# Patient Record
Sex: Female | Born: 1965 | Race: White | Hispanic: No | Marital: Married | Smoking: Never smoker
Health system: Southern US, Community
[De-identification: ages and names within clinical notes are randomized; demographics above are authoritative.]

## PROBLEM LIST (undated history)

## (undated) DIAGNOSIS — F419 Anxiety disorder, unspecified: Secondary | ICD-10-CM

## (undated) DIAGNOSIS — E669 Obesity, unspecified: Secondary | ICD-10-CM

## (undated) DIAGNOSIS — E1169 Type 2 diabetes mellitus with other specified complication: Secondary | ICD-10-CM

## (undated) DIAGNOSIS — I10 Essential (primary) hypertension: Secondary | ICD-10-CM

## (undated) DIAGNOSIS — Z789 Other specified health status: Secondary | ICD-10-CM

## (undated) HISTORY — PX: ABDOMINAL HYSTERECTOMY: SHX81

## (undated) HISTORY — PX: APPENDECTOMY: SHX54

## (undated) HISTORY — PX: LAPAROSCOPIC GASTRIC BANDING: SHX1100

---

## 1997-03-01 ENCOUNTER — Inpatient Hospital Stay (HOSPITAL_COMMUNITY): Admission: AD | Admit: 1997-03-01 | Discharge: 1997-03-01 | Payer: Self-pay | Admitting: Gynecology

## 1997-04-07 ENCOUNTER — Observation Stay (HOSPITAL_COMMUNITY): Admission: AD | Admit: 1997-04-07 | Discharge: 1997-04-08 | Payer: Self-pay | Admitting: Gynecology

## 1997-04-15 ENCOUNTER — Inpatient Hospital Stay (HOSPITAL_COMMUNITY): Admission: AD | Admit: 1997-04-15 | Discharge: 1997-04-20 | Payer: Self-pay | Admitting: Obstetrics and Gynecology

## 1997-04-23 ENCOUNTER — Inpatient Hospital Stay (HOSPITAL_COMMUNITY): Admission: AD | Admit: 1997-04-23 | Discharge: 1997-04-23 | Payer: Self-pay | Admitting: Obstetrics and Gynecology

## 1997-04-24 ENCOUNTER — Inpatient Hospital Stay (HOSPITAL_COMMUNITY): Admission: AD | Admit: 1997-04-24 | Discharge: 1997-04-24 | Payer: Self-pay | Admitting: Obstetrics and Gynecology

## 1997-04-27 ENCOUNTER — Inpatient Hospital Stay (HOSPITAL_COMMUNITY): Admission: AD | Admit: 1997-04-27 | Discharge: 1997-04-30 | Payer: Self-pay | Admitting: Gynecology

## 1997-06-09 ENCOUNTER — Ambulatory Visit (HOSPITAL_COMMUNITY): Admission: RE | Admit: 1997-06-09 | Discharge: 1997-06-09 | Payer: Self-pay | Admitting: Gynecology

## 2002-12-08 ENCOUNTER — Other Ambulatory Visit: Admission: RE | Admit: 2002-12-08 | Discharge: 2002-12-08 | Payer: Self-pay | Admitting: Obstetrics and Gynecology

## 2003-04-26 ENCOUNTER — Ambulatory Visit (HOSPITAL_COMMUNITY): Admission: RE | Admit: 2003-04-26 | Discharge: 2003-04-26 | Payer: Self-pay | Admitting: Orthopedic Surgery

## 2003-12-21 ENCOUNTER — Ambulatory Visit (HOSPITAL_COMMUNITY): Admission: RE | Admit: 2003-12-21 | Discharge: 2003-12-21 | Payer: Self-pay | Admitting: Family Medicine

## 2004-01-10 ENCOUNTER — Ambulatory Visit (HOSPITAL_COMMUNITY): Admission: RE | Admit: 2004-01-10 | Discharge: 2004-01-10 | Payer: Self-pay | Admitting: Otolaryngology

## 2004-01-31 ENCOUNTER — Encounter: Admission: RE | Admit: 2004-01-31 | Discharge: 2004-04-30 | Payer: Self-pay

## 2004-02-06 ENCOUNTER — Ambulatory Visit (HOSPITAL_COMMUNITY): Admission: RE | Admit: 2004-02-06 | Discharge: 2004-02-06 | Payer: Self-pay | Admitting: Family Medicine

## 2004-07-19 ENCOUNTER — Encounter: Payer: Self-pay | Admitting: Obstetrics and Gynecology

## 2004-07-19 ENCOUNTER — Inpatient Hospital Stay (HOSPITAL_COMMUNITY): Admission: RE | Admit: 2004-07-19 | Discharge: 2004-07-22 | Payer: Self-pay | Admitting: Obstetrics and Gynecology

## 2006-07-11 ENCOUNTER — Emergency Department (HOSPITAL_COMMUNITY): Admission: EM | Admit: 2006-07-11 | Discharge: 2006-07-11 | Payer: Self-pay | Admitting: Emergency Medicine

## 2006-07-17 ENCOUNTER — Emergency Department (HOSPITAL_COMMUNITY): Admission: EM | Admit: 2006-07-17 | Discharge: 2006-07-17 | Payer: Self-pay | Admitting: Emergency Medicine

## 2006-07-17 ENCOUNTER — Encounter (HOSPITAL_COMMUNITY): Admission: RE | Admit: 2006-07-17 | Discharge: 2006-08-16 | Payer: Self-pay | Admitting: Orthopaedic Surgery

## 2006-08-19 ENCOUNTER — Encounter (HOSPITAL_COMMUNITY): Admission: RE | Admit: 2006-08-19 | Discharge: 2006-09-18 | Payer: Self-pay | Admitting: Orthopaedic Surgery

## 2006-09-17 ENCOUNTER — Encounter: Admission: RE | Admit: 2006-09-17 | Discharge: 2006-09-17 | Payer: Self-pay | Admitting: Neurosurgery

## 2006-11-05 ENCOUNTER — Encounter (HOSPITAL_COMMUNITY): Admission: RE | Admit: 2006-11-05 | Discharge: 2006-12-05 | Payer: Self-pay | Admitting: Anesthesiology

## 2006-12-09 ENCOUNTER — Encounter (HOSPITAL_COMMUNITY): Admission: RE | Admit: 2006-12-09 | Discharge: 2007-01-08 | Payer: Self-pay | Admitting: Anesthesiology

## 2008-08-19 ENCOUNTER — Encounter (INDEPENDENT_AMBULATORY_CARE_PROVIDER_SITE_OTHER): Payer: Self-pay | Admitting: Family Medicine

## 2008-08-19 ENCOUNTER — Ambulatory Visit (HOSPITAL_COMMUNITY): Admission: RE | Admit: 2008-08-19 | Discharge: 2008-08-19 | Payer: Self-pay | Admitting: Family Medicine

## 2008-10-16 ENCOUNTER — Emergency Department (HOSPITAL_COMMUNITY): Admission: EM | Admit: 2008-10-16 | Discharge: 2008-10-16 | Payer: Self-pay | Admitting: Emergency Medicine

## 2009-01-11 ENCOUNTER — Encounter: Payer: Self-pay | Admitting: Gastroenterology

## 2009-02-06 ENCOUNTER — Ambulatory Visit: Payer: Self-pay | Admitting: Gastroenterology

## 2009-02-06 ENCOUNTER — Ambulatory Visit (HOSPITAL_COMMUNITY): Admission: RE | Admit: 2009-02-06 | Discharge: 2009-02-06 | Payer: Self-pay | Admitting: Gastroenterology

## 2009-02-08 ENCOUNTER — Encounter (INDEPENDENT_AMBULATORY_CARE_PROVIDER_SITE_OTHER): Payer: Self-pay

## 2009-10-17 ENCOUNTER — Ambulatory Visit (HOSPITAL_COMMUNITY): Admission: RE | Admit: 2009-10-17 | Discharge: 2009-10-17 | Payer: Self-pay | Admitting: Family Medicine

## 2010-02-20 NOTE — Letter (Signed)
Summary: Patient Notice, Colon Biopsy Results  [facility]  [address]   [place], [place] [address]   Phone: [phone]  Fax: [phone]       February 08, 2009   [name] [address] [place], [place]  [address] 04/21/1965    Dear Ms. Pacer,  I am pleased to inform you that the biopsies taken during your recent colonoscopy did not show any evidence of cancer upon pathologic examination.  Additional information/recommendations:  __Please inform all first degree relatives (brothers, sisters, children etc) that they should have screening colonoscopy starting at age 19  __You should have a repeat colonoscopy examination  in 5 years.  Please call us if you are having persistent problems or have questions about your condition that have not been fully answered at this time.  Sincerely,    Hendricks Limes LPN  [facility] Ph: [phone]    Fax: [phone]

## 2010-06-08 NOTE — Op Note (Signed)
NAMELUCINA, [name] NO.:  [id number]   MEDICAL RECORD NO.:  [id number]          PATIENT TYPE:  INP   LOCATION:  A418                          FACILITY:  [facility]   PHYSICIAN:  Tilda Burrow, M.D. DATE OF BIRTH:  1966/01/12   DATE OF PROCEDURE:  07/19/2004  DATE OF DISCHARGE:                                 OPERATIVE REPORT   PREOPERATIVE DIAGNOSES:  1.  Dyspareunia.  2.  Right lower quadrant pain.  3.  Obesity with panniculus.   POSTOPERATIVE DIAGNOSES:  1.  Dyspareunia.  2.  Right lower quadrant pain.  3.  Obesity with panniculus.   OPERATION PERFORMED:  1.  Total abdominal hysterectomy.  2.  Right salpingo-oophorectomy.  3.  Appendectomy.  4.  Panniculectomy.   SURGEON:  Tilda Burrow, M.D.   ASSISTANT:  _Deborah  Dallas, R. N.   ANESTHESIA:  General.   COMPLICATIONS:  None.   FINDINGS:  Grossly normal ovaries bilaterally, mild, upper limits.  Normal  uterus.  Extensive bladder flap adhesions fro sites of prior cesarean  sections.   DETAILS OF SURGERY:  The patient was brought to the operating room, and was  prepped and draped for command abdominal and vaginal procedures with  Flowtron's all in place.  The Foley catheter was inserted.  Vaginal prepping  was performed  and the abdomen was prepped.  She had previously been marked  for the extent of the panniculectomy.  This marked area was approximately 4  inches at the anterosuperior iliac crest, 5 inches at the midline and  approximately 80 cm in total length.  This was then incised beginning first  on the right side, excising the inferior margin of the panniculectomy area  staying just above the inguinal crease all the way to the anterosuperior  iliac crest, and extending midway between the anterior and posterior iliac  wing.  We were able to undermine the tissue and remove a layer of fatty  tissue along with the skin leaving a 1 cm or more area of subcutaneous fat  on the overlying  fascia.  This easily allowed removal of the 10 cm wide  ellipse of skin and we were able to achieve hemostasis with point cautery as  necessary.  We then irrigated and immediately proceeded to close the lateral  aspects of the panniculectomy defect using interrupted 2-0 plain sutures.   The middle portion of the abdomen was left with access to the midline.  We  then entered the abdomen with Pelosi incision modification.  We used the  Bovie cautery down to the fascia, opened the fascia in the midline.  We were  able to inspect and found no evidence of any injury related to the entry  process.  The bowel was packed away.  The uterus was identified and found to  be densely adherent on its anterior lower uterine segment.  A vertical  midline incision was performed.  The bowel was packed away.  We then took  down the round ligament from both sides of the uterus.  I inspected the  right adnexa and considered leaving the right ovary; but,  based on the  patient's complaints and specific request that the right ovary be removed  based on her preoperative pain and complaints we went ahead with removal of  the right tube and ovary.  The round ligaments were taken down bilaterally  with a suture ligature, transected these and the right infundibulopelvic  ligament isolated.  This was clamped, cut and suture ligated.  The left side  was left in situ and we crossclamped the utero-ovarian ligament on the left,  doubly ligating.  Hemostasis was achieved.   We then marched down the broad ligament and the upper cardinal ligaments,  serially clamping, cutting and suture ligating on either side.  The patient  had the uterus taken down, clamping, cutting and suture ligating on either  side reaching the cervix.  We amputated the upper portion of the uterus off  the lower uterine segment and then continued to march down to the cervix.  The cervix was amputated off the vaginal cuff and all rigid stitches placed   at each lateral vaginal angle.  The cuff was then closed in the midline  being careful to develop a small pouch in the posterior aspect of the cuff  to reduce _likelihood of__  dyspareunia in the future.   Irrigation of the pelvis was followed.  Then at the completion of the  procedure we closed the peritoneum after adequate hemostasis.   The patient had closure of the abdominal incision with placement of two flat  JP drains beneath the _subcutaneous skin and fatty tissue .   The patient went to the recovery room in good condition.   COUNTS:  Sponge and needle counts were correct.       JVF/MEDQ  D:  07/20/2004  T:  07/20/2004  Job:  [id number]

## 2010-06-08 NOTE — Op Note (Signed)
NAME:  [name], [name]                     ACCOUNT NO.:  [id number]   MEDICAL RECORD NO.:  [id number]                   PATIENT TYPE:  AMB   LOCATION:  DAY                                  FACILITY:  [facility]   PHYSICIAN:  Vickki Hearing, M.D.           DATE OF BIRTH:  1965-04-22   DATE OF PROCEDURE:  DATE OF DISCHARGE:                                 OPERATIVE REPORT   PREOPERATIVE DIAGNOSIS:  Right volar ganglion.   POSTOPERATIVE DIAGNOSIS:  Right volar ganglion.   PROCEDURE:  Excision of mass right wrist.   SURGEON:  Vickki Hearing, M.D.   OPERATIVE FINDINGS:  Right volar ganglion.   Painful lesion of the right wrist over its volar aspect consistent with a  ganglion cyst.   The patient was identified preoperatively as [name].  She placed  a mark over her right wrist and signed by initials over that.  We reviewed  her medical record including consent and history and physical.  She was  given preoperative antibiotics and taken to the operating room for a Bier  block.  After sterile prep and drape we took the appropriate time out,  confirmed her identity, proper extremity for surgery and the proper  procedure; everyone agreed we proceeded.   A longitudinal incision was made over the mass.  This was bluntly dissected  until the mass was removed.  There were several small veins and an arterial  branch wrapped around the mass and these were carefully dissected from the  mass and appropriate cautery was used as needed.   The mass was removed and sent for pathologic specimen.  The wound was  irrigated, closed with 2-0 Vicryl and 4-0 Biosyn absorbable suture.  We  applied sterile dressings, released the tourniquet, checked her pulse and  perfusion which was good.  She had had a preoperative Allen's test which was  normal for volar dominant arterial flow to the hand.   The patient was taken to the recovery room in stable condition. Postop plan  is for her to  follow up with me in 2 days for a dressing change.  She is to  start early opening and closing of the hand.      ___________________________________________                                            Vickki Hearing, M.D.   SEH/MEDQ  D:  04/26/2003  T:  04/26/2003  Job:  [id number]

## 2010-06-08 NOTE — H&P (Signed)
NAMEATLANTIS, DELONG NO.:  [id number]   MEDICAL RECORD NO.:  [id number]         PATIENT TYPE:  PAMB   LOCATION:                                FACILITY:  [facility]   PHYSICIAN:  Tilda Burrow, M.D. DATE OF BIRTH:  April 30, 1965   DATE OF ADMISSION:  07/19/2004  DATE OF DISCHARGE:  LH                                HISTORY & PHYSICAL   ADMISSION DIAGNOSES:  1.  Menorrhagia.  2.  Dyspareunia.  3.  Unusually long cervix.  4.  Right lower quadrant pain.   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  She  has been seen in our office and evaluated, has a uterus measuring 6.8 cm AP  x 5.4 cm transverse x 9 cm in length.  The left ovary is normal at 2.3 x 3.3  cm.  The right ovary is symptomatically uncomfortable with vaginal contact.  She has no uterine descensus and is not a candidate for vaginal  hysterectomy.  Plans are for hysterectomy, right salpingo-oophorectomy, and  appendectomy.  Additionally, the patient has had two prior cesarean sections  through two separate parallel incisions, 2 to 3 cm apart, with unacceptable  irregular appearance to the patient. Our plans are for a panniculectomy to  be extending from lateral to the anterior superior iliac crest on each side  of midline with panniculectomy to be approximately 12 to 14 cm.  Plans are  to remove underlying skin and subcutaneous fatty tissue.  This has been  sketched out with the patient in the standing and sitting position today  preop, July 16, 2004.   PAST MEDICAL HISTORY:  Positive for gestational diabetes.   PAST SURGICAL HISTORY:  1.  Cesarean section x 3, 1990, 1992, and  1999 with tubal ligation at time      of third surgery.  2.  Laparoscopic cholecystectomy in 1994.  3.  Excision of ganglion cyst right breast in 2005.  4.  Bunionectomy of left foot in [facility] at [facility], date      unknown.   ALLERGIES:  SULFA, ANCEF which caused rash.  VICODIN causes mild rash but is  tolerable to patient if she needs to take it.   MEDICATIONS:  Flexeril for headaches or migraines.   PHYSICAL EXAMINATION:  VITAL SIGNS:  Height 5 feet 5 inches, weight 216,  which is a 5 to 7-pound weight loss from her usual weight. Blood pressure  180/70, previously noted at 108/70.  Pap smear is Class I.  GC and chlamydia  are negative.  GENERAL:  Cheerful, energetic, oriented x 3, Caucasian female who has  realistic expectations about her surgery.  HEENT:  Pupils equal, round, and reactive.  Extraocular movements intact.  NECK:  Supple.  CHEST: Clear to auscultation.  BREASTS:  Exam  deferred.  CARDIAC:  Regular rate and rhythm.  LUNGS:  Clear.  ABDOMEN:  Well-healed surgical scars with irregular lower abdominal skin in  the area of panniculus and prior cesareans.  EXTREMITIES:  Grossly normal.  PELVIC:  External genitalia normal, parous introitus, vaginal length good.  Cervix long, filling upper vagina apex.  Uterus 9 cm in length.  Right  adnexa mildly tender with vaginal contact but otherwise negative.  No masses  on bimanual.   PLAN:  1.  Bowel prep.  2.  Lengthy discussion about the risks of larger incisions.  3.  Plans are for hysterectomy and right salpingo-oophorectomy.  4.  Left tube and ovary to be inspected. She would rather these be removed      than any visible pathology be left behind.  5.  Appendectomy is planned as well.  6.  Panniculectomy already marked out.  To be performed July 19, 2004.       JVF/MEDQ  D:  07/16/2004  T:  07/16/2004  Job:  [id number]

## 2010-06-08 NOTE — H&P (Signed)
NAME:  [name], [name] NO.:  [id number]   MEDICAL RECORD NO.:  [id number]                  PATIENT TYPE:   LOCATION:                                       FACILITY:   PHYSICIAN:  Vickki Hearing, M.D.           DATE OF BIRTH:  03-10-1965   DATE OF ADMISSION:  04/21/2003  DATE OF DISCHARGE:                                HISTORY & PHYSICAL   CHIEF COMPLAINT:  Mass, right wrist.   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  The patient reports painful mass in the right wrist present now x4-1/2  weeks.  Initially, it was thought to come and go ranging in size.  It is now  associated with painful motion of the right wrist with some occasional  tingling.  She wishes to have it removed and has a 20% recurrence rate.  She  expects to be out of work x2 weeks.   ALLERGIES:  SULFA and ANCEF.   PAST SURGICAL HISTORY:  1. Laparoscopic cholecystectomy.  2. Bunionectomy, left foot.  3. BTL.  4. Left wrist surgery.   REVIEW OF SYMPTOMS:  Weight gain, migraines, seasonal allergies, otherwise  normal.   PHYSICAL EXAMINATION:  VITAL SIGNS:  Weight 205, pulse 80, respirations 20.  GENERAL:  She is of normal development, grooming and hygiene with normal  body habitus and no deformities.  EXTREMITIES:  Perfusion is intact.  Allen test normal.  No varicosities.  Temperature normal.  Inspection shows an approximate 2 x 1 cm mass over the  right volar radial aspect of the wrist.  Range of motion is normal.  Stability and strength are normal.  Skin is intact.  Left calf tick bite  appears to be infected with erythema and induration, but no target lesion.  LYMPHS:  No lymph nodes in the region.  NEUROLOGIC:   Normal sensation, reflexes and orientation without depression  or anxiety.   ASSESSMENT:  Right volar ganglion of the wrist.  Recommend excision.   PLAN:  Remove ganglion of right wrist.  Followup scheduled on April 7.     ___________________________________________                                         Vickki Hearing, M.D.   SEH/MEDQ  D:  04/21/2003  T:  04/21/2003  Job:  [id number]

## 2010-06-08 NOTE — Discharge Summary (Signed)
NAMEMAYLA, BIDDY NO.:  [id number]   MEDICAL RECORD NO.:  [id number]          PATIENT TYPE:  INP   LOCATION:  A418                          FACILITY:  [facility]   PHYSICIAN:  Lazaro Arms, M.D.   DATE OF BIRTH:  October 10, 1965   DATE OF ADMISSION:  07/19/2004  DATE OF DISCHARGE:  07/02/2006LH                                 DISCHARGE SUMMARY   DISCHARGE DIAGNOSES:  1.  Status post a total abdominal hysterectomy, right salpingo-oophorectomy      and an abdominal panniculectomy.  2.  Unremarkable postoperative course.   PROCEDURE:  TAH, RSO, panniculectomy.   Please refer to the transcribed history and physical and the operative  report for details of admission to the hospital.   HOSPITAL COURSE:  Patient was admitted postoperatively.  She did quite well.  She tolerated clear liquids and a regular diet.  She was ambulatory.  Initially, she could not void but after one in-and-out cath she voided with  no difficulty.  Her postop day #1 hemoglobin was 12.4, hematocrit 34.6 and  on postop day #3 it is 11.7 and 32.6 and a white count of 7000.  She  remained afebrile.  She was extensively ambulatory.  She tolerated  transition from IV to oral pain medicine.  Her incision at the time of  discharge was clean, dry, and intact.  Her JP drains were putting out 70 mL  on the 24 hours prior to discharge so they were kept in.  She is discharged  to home to follow up in the office on Friday.  She is given Tylox for pain  and Levaquin as a prophylactic antibiotic because of the panniculectomy and  drains.  If she has any questions between now and then she will give [place] a  call.       LHE/MEDQ  D:  07/22/2004  T:  07/22/2004  Job:  [id number]

## 2012-03-12 ENCOUNTER — Other Ambulatory Visit (HOSPITAL_COMMUNITY): Payer: Self-pay | Admitting: Family Medicine

## 2012-03-12 DIAGNOSIS — Z139 Encounter for screening, unspecified: Secondary | ICD-10-CM

## 2012-03-20 ENCOUNTER — Ambulatory Visit (HOSPITAL_COMMUNITY)
Admission: RE | Admit: 2012-03-20 | Discharge: 2012-03-20 | Disposition: A | Discharge status: Home / Self Care | Payer: 59 | Source: Ambulatory Visit | Attending: Family Medicine | Admitting: Family Medicine

## 2012-03-20 DIAGNOSIS — Z1231 Encounter for screening mammogram for malignant neoplasm of breast: Secondary | ICD-10-CM | POA: Insufficient documentation

## 2012-03-20 DIAGNOSIS — Z139 Encounter for screening, unspecified: Secondary | ICD-10-CM

## 2012-04-16 ENCOUNTER — Inpatient Hospital Stay (HOSPITAL_COMMUNITY)
Admission: AD | Admit: 2012-04-16 | Discharge: 2012-04-16 | Disposition: A | Discharge status: Home / Self Care | Payer: 59 | Source: Ambulatory Visit | Attending: Obstetrics and Gynecology | Admitting: Obstetrics and Gynecology

## 2012-04-16 ENCOUNTER — Encounter (HOSPITAL_COMMUNITY): Payer: Self-pay

## 2012-04-16 DIAGNOSIS — N764 Abscess of vulva: Secondary | ICD-10-CM | POA: Insufficient documentation

## 2012-04-16 HISTORY — DX: Other specified health status: Z78.9

## 2012-04-16 LAB — URINALYSIS, ROUTINE W REFLEX MICROSCOPIC
Bilirubin Urine: NEGATIVE
Glucose, UA: NEGATIVE mg/dL
Hgb urine dipstick: NEGATIVE
Ketones, ur: 15 mg/dL — AB
Nitrite: NEGATIVE
Protein, ur: NEGATIVE mg/dL
Specific Gravity, Urine: >1.03 — ABNORMAL HIGH (ref 1.005–1.030)
pH: 5.5 (ref 5.0–8.0)

## 2012-04-16 LAB — URINE MICROSCOPIC-ADD ON

## 2012-04-16 MED ORDER — OXYCODONE-ACETAMINOPHEN 5-325 MG PO TABS: 1.0000 | ORAL_TABLET | Freq: Four times a day (QID) | ORAL | Status: DC | PRN

## 2012-04-16 MED ORDER — OXYCODONE-ACETAMINOPHEN 5-325 MG PO TABS: 1.0000 | ORAL_TABLET | Freq: Once | ORAL | Status: DC

## 2012-04-16 MED ORDER — HYDROMORPHONE HCL 2 MG PO TABS
2.0000 mg | ORAL_TABLET | Freq: Once | ORAL | Status: AC
  Administered 2012-04-16: 2 mg via ORAL
  Filled 2012-04-16: qty 1

## 2012-04-16 MED ORDER — LIDOCAINE HCL 2 % EX GEL: Freq: Three times a day (TID) | CUTANEOUS | Status: DC

## 2012-04-16 MED ORDER — HYDROMORPHONE HCL 2 MG PO TABS: 2.0000 mg | ORAL_TABLET | ORAL | Status: DC | PRN

## 2012-04-16 MED ORDER — DOXYCYCLINE HYCLATE 100 MG PO CAPS: 100.0000 mg | ORAL_CAPSULE | Freq: Two times a day (BID) | ORAL | Status: DC

## 2012-04-16 MED ORDER — LIDOCAINE HCL 2 % EX GEL
Freq: Once | CUTANEOUS | Status: AC
  Administered 2012-04-16: 5 via TOPICAL
  Filled 2012-04-16: qty 20

## 2012-04-16 NOTE — MAU Note (Signed)
Patient states she has an abscess, probably Bartholin Cyst, on the left side that started 3-22, getting worse and bigger.

## 2012-04-16 NOTE — MAU Provider Note (Signed)
  History     CSN: [id number]  Arrival date and time: 04/16/12 1652   First Provider Initiated Contact with Patient 04/16/12 2028      Chief Complaint  Patient presents with  . Abscess   HPI [name] 47 y.o.  Has had swelling on vulva for 3 days.  Is very painful and thinks it is a Bartholin's cyst (from info on the internet).  OB History   Grav Para Term Preterm Abortions TAB SAB Ect Mult Living   4 4 2 2      4       Past Medical History  Diagnosis Date  . Medical history non-contributory     Past Surgical History  Procedure Laterality Date  . Cesarean section    . Abdominal hysterectomy    . Appendectomy      History reviewed. No pertinent family history.  History  Substance Use Topics  . Smoking status: Never Smoker   . Smokeless tobacco: Not on file  . Alcohol Use: Yes     Comment: occasional    Allergies:  Allergies  Allergen Reactions  . Other     Steroids  Causes patients hair to fall out  . Sulfa Antibiotics Swelling  . Ancef (Cefazolin) Swelling and Rash    Prescriptions prior to admission  Medication Sig Dispense Refill  . ALPRAZolam (XANAX) 0.5 MG tablet Take 0.5 mg by mouth daily as needed for sleep or anxiety.      Marland Kitchen ibuprofen (ADVIL,MOTRIN) 200 MG tablet Take 800 mg by mouth daily as needed for pain or headache.      . sertraline (ZOLOFT) 25 MG tablet Take 25 mg by mouth daily.        Review of Systems  Constitutional: Negative for fever.  Gastrointestinal: Negative for nausea, vomiting and abdominal pain.  Genitourinary:       Vulvar pain   Physical Exam   Blood pressure 147/87, pulse 77, temperature 98.5 F (36.9 C), temperature source Oral, resp. rate 20, height 5\' 5"  (1.651 m), weight 263 lb 3.2 oz (119.387 kg), SpO2 98.00%.  Physical Exam  Nursing note and vitals reviewed. Constitutional: She is oriented to person, place, and time. She appears well-developed and well-nourished.  obese  HENT:  Head: Normocephalic.   Eyes: EOM are normal.  Neck: Neck supple.  Genitourinary:  Labia minora on left side is swollen and tender.  Circular area of 1-1.5 cm.  Firm, no area of drainage noted.  Musculoskeletal: Normal range of motion.  Neurological: She is alert and oriented to person, place, and time.  Skin: Skin is warm and dry.  Psychiatric: She has a normal mood and affect.    MAU Course  Procedures  MDM 2025  Dr. Jolayne Panther notified - need to see patient 2030  Dr. Jolayne Panther in and did exam - Discussed plan of care.  Assessment and Plan  Labial abscess  Plan Continue warm soaks in warm water 4 times a day. rx doxycycline 100  mg PO bID x 10days (#20) no refills Rx oxycodone/acetaminophen 325 mg one po q 6hours (24) no refills Given some xydocaine jelly to the area in MAU.   BURLESON,TERRI 04/16/2012, 8:28 PM

## 2012-04-16 NOTE — MAU Note (Signed)
Not in lobby #2 

## 2012-04-16 NOTE — MAU Note (Signed)
Not in lobby#1 

## 2012-04-17 NOTE — MAU Provider Note (Signed)
Attestation of Attending Supervision of Advanced Practitioner (CNM/NP): Evaluation and management procedures were performed by the Advanced Practitioner under my supervision and collaboration.  I have reviewed the Advanced Practitioner's note and chart, and I agree with the management and plan.  [name] 04/17/2012 8:09 AM

## 2012-04-22 ENCOUNTER — Encounter: Payer: Self-pay | Admitting: Advanced Practice Midwife

## 2012-04-22 ENCOUNTER — Ambulatory Visit (INDEPENDENT_AMBULATORY_CARE_PROVIDER_SITE_OTHER): Payer: 59 | Admitting: Advanced Practice Midwife

## 2012-04-22 VITALS — BP 130/78 | Wt 264.5 lb

## 2012-04-22 DIAGNOSIS — N764 Abscess of vulva: Secondary | ICD-10-CM

## 2012-04-22 DIAGNOSIS — R319 Hematuria, unspecified: Secondary | ICD-10-CM

## 2012-04-23 NOTE — Progress Notes (Signed)
   Chief Complaint:  Follow-up   [name] is  47 y.o. [phone].  No LMP recorded. Patient has had a hysterectomy.Marland Kitchen She was seen in MAU this weekend for a L labial abscess.  She was given antibiotics and told to follow up this week.  She states it popped and drained on it's own a few days ago, and is much smaller and feels much better.   Past Medical History  Diagnosis Date  . Medical history non-contributory     Past Surgical History  Procedure Laterality Date  . Cesarean section    . Abdominal hysterectomy    . Appendectomy      Family History  Problem Relation Age of Onset  . Cancer Maternal Grandmother     breast  . Transient ischemic attack Maternal Grandmother   . Cancer Maternal Grandfather     lung  . Cancer Paternal Grandmother     breast  . Diabetes Paternal Grandfather   . Heart disease Paternal Grandfather     History  Substance Use Topics  . Smoking status: Never Smoker   . Smokeless tobacco: Not on file  . Alcohol Use: Yes     Comment: occasional    Allergies:  Allergies  Allergen Reactions  . Codeine Itching and Swelling  . Other     Steroids  Causes patients hair to fall out  . Sulfa Antibiotics Swelling  . Ancef (Cefazolin) Swelling and Rash      Review of Systems  Review of Systems  Constitutional: Negative for fever, chills, weight loss, malaise/fatigue and diaphoresis.  HENT: Negative for hearing loss, ear pain, nosebleeds, congestion, sore throat, neck pain, tinnitus and ear discharge.   Eyes: Negative for blurred vision, double vision, photophobia, pain, discharge and redness.  Respiratory: Negative for cough, hemoptysis, sputum production, shortness of breath, wheezing and stridor.   Cardiovascular: Negative for chest pain, palpitations, orthopnea,  leg swelling  Gastrointestinal:negative for abdominal pain,heartburn, nausea, vomiting, diarrhea, constipation, blood in stool Genitourinary: Negative for dysuria, urgency,  frequency, hematuria and flank pain. Labial pain MUCH BETTER Musculoskeletal: Negative for myalgias, back pain, joint pain and falls.  Skin: Negative for itching and rash.  Neurological: Negative for dizziness, tingling, tremors, sensory change, speech change, focal weakness, seizures, loss of consciousness, weakness and headaches.  Endo/Heme/Allergies: Negative for environmental allergies and polydipsia. Does not bruise/bleed easily.  Psychiatric/Behavioral: Negative for depression, suicidal ideas, hallucinations, memory loss and substance abuse. The patient is not nervous/anxious and does not have insomnia.      Physical Exam   Blood pressure 130/78, weight 264 lb 8 oz (119.976 kg).  General: General appearance - alert, well appearing, and in no distress Pelvic - tiny healing abscess.  Not red, nontender, no drainage      Assessment: L labial abscess, healing well  Plan: Finish antibiotics  CRESENZO-DISHMAN,Kelsen Celona

## 2012-10-03 ENCOUNTER — Emergency Department (INDEPENDENT_AMBULATORY_CARE_PROVIDER_SITE_OTHER): Payer: 59

## 2012-10-03 ENCOUNTER — Encounter (HOSPITAL_COMMUNITY): Payer: Self-pay | Admitting: Emergency Medicine

## 2012-10-03 ENCOUNTER — Emergency Department (INDEPENDENT_AMBULATORY_CARE_PROVIDER_SITE_OTHER)
Admission: EM | Admit: 2012-10-03 | Discharge: 2012-10-03 | Disposition: A | Discharge status: Home / Self Care | Payer: 59 | Source: Home / Self Care | Attending: Family Medicine | Admitting: Family Medicine

## 2012-10-03 DIAGNOSIS — J069 Acute upper respiratory infection, unspecified: Secondary | ICD-10-CM

## 2012-10-03 DIAGNOSIS — J40 Bronchitis, not specified as acute or chronic: Secondary | ICD-10-CM

## 2012-10-03 HISTORY — DX: Anxiety disorder, unspecified: F41.9

## 2012-10-03 LAB — CBC WITH DIFFERENTIAL/PLATELET
Basophils Absolute: 0.1 10*3/uL (ref 0.0–0.1)
Basophils Relative: 1 % (ref 0–1)
Eosinophils Relative: 2 % (ref 0–5)
HCT: 41.1 % (ref 36.0–46.0)
Lymphocytes Relative: 26 % (ref 12–46)
MCHC: 34.3 g/dL (ref 30.0–36.0)
MCV: 87.8 fL (ref 78.0–100.0)
Monocytes Absolute: 0.6 10*3/uL (ref 0.1–1.0)
Neutro Abs: 4.6 10*3/uL (ref 1.7–7.7)
Platelets: 233 10*3/uL (ref 150–400)
RDW: 12.8 % (ref 11.5–15.5)
WBC: 7.2 10*3/uL (ref 4.0–10.5)

## 2012-10-03 LAB — POCT I-STAT, CHEM 8
Calcium, Ion: 1.15 mmol/L (ref 1.12–1.23)
Glucose, Bld: 171 mg/dL — ABNORMAL HIGH (ref 70–99)
HCT: 44 % (ref 36.0–46.0)
Hemoglobin: 15 g/dL (ref 12.0–15.0)

## 2012-10-03 LAB — D-DIMER, QUANTITATIVE: D-Dimer, Quant: <0.27 ug/mL-FEU (ref 0.00–0.48)

## 2012-10-03 MED ORDER — ALBUTEROL SULFATE (2.5 MG/3ML) 0.083% IN NEBU: 2.5000 mg | INHALATION_SOLUTION | Freq: Four times a day (QID) | RESPIRATORY_TRACT | Status: AC | PRN

## 2012-10-03 MED ORDER — AZITHROMYCIN 250 MG PO TABS: ORAL_TABLET | ORAL | Status: DC

## 2012-10-03 MED ORDER — ALBUTEROL SULFATE (5 MG/ML) 0.5% IN NEBU
INHALATION_SOLUTION | RESPIRATORY_TRACT | Status: AC
  Filled 2012-10-03: qty 0.5

## 2012-10-03 MED ORDER — IPRATROPIUM-ALBUTEROL 0.5-2.5 (3) MG/3ML IN SOLN
3.0000 mL | RESPIRATORY_TRACT | Status: DC
  Administered 2012-10-03: 3 mL via RESPIRATORY_TRACT

## 2012-10-03 MED ORDER — METHYLPREDNISOLONE 4 MG PO KIT: PACK | ORAL | Status: DC

## 2012-10-03 NOTE — ED Notes (Signed)
Patient complains of chest tightness starting Thursday;  Patient feels like she is unable to "move air"  Patient does not have a history of asthma; Patient states she also has a dry cough;  Patient has tried Mucinex without relief

## 2012-10-03 NOTE — ED Provider Notes (Signed)
CSN: [id number]     Arrival date & time 10/03/12  1028 History   First MD Initiated Contact with Patient 10/03/12 1122     Chief Complaint  Patient presents with  . Shortness of Breath  . Cough   (Consider location/radiation/quality/duration/timing/severity/associated sxs/prior Treatment) HPI Comments: 47 year old female presents complaining of cough, chest tightness, shortness of breath, feeling like she is unable to move air. She also feels like she is wheezing. She has been taking Mucinex which she feels may have slightly loosened this up but not significantly. The cough has been dry and nonproductive. She also admits to recent history of long car ride and unilateral leg swelling with pitting edema on the left. This edema has resolved and apparently was only in the left leg. This resolved as of yesterday. She is no history of DVT or PE. She does not smoke and does not take any hormonal contraceptives.  Patient is a 47 y.o. female presenting with shortness of breath and cough.  Shortness of Breath Associated symptoms: cough and sore throat   Associated symptoms: no abdominal pain, no chest pain, no ear pain, no fever, no neck pain, no rash and no vomiting   Cough Associated symptoms: sore throat   Associated symptoms: no chest pain, no chills, no ear pain, no fever, no myalgias, no rash, no rhinorrhea and no shortness of breath     Past Medical History  Diagnosis Date  . Medical history non-contributory   . Anxiety    Past Surgical History  Procedure Laterality Date  . Cesarean section    . Abdominal hysterectomy    . Appendectomy    . Laparoscopic gastric banding     Family History  Problem Relation Age of Onset  . Cancer Maternal Grandmother     breast  . Transient ischemic attack Maternal Grandmother   . Cancer Maternal Grandfather     lung  . Cancer Paternal Grandmother     breast  . Diabetes Paternal Grandfather   . Heart disease Paternal Grandfather    History   Substance Use Topics  . Smoking status: Never Smoker   . Smokeless tobacco: Not on file  . Alcohol Use: Yes     Comment: occasional   OB History   Grav Para Term Preterm Abortions TAB SAB Ect Mult Living   4 4 2 2      4      Review of Systems  Constitutional: Negative for fever and chills.  HENT: Positive for sore throat and voice change. Negative for ear pain, congestion, rhinorrhea, sneezing, neck pain, postnasal drip and sinus pressure.   Eyes: Negative for visual disturbance.  Respiratory: Positive for cough and chest tightness. Negative for shortness of breath.   Cardiovascular: Positive for leg swelling (left side, resolved). Negative for chest pain and palpitations.  Gastrointestinal: Negative for nausea, vomiting and abdominal pain.  Endocrine: Negative for polydipsia and polyuria.  Genitourinary: Negative for dysuria, urgency and frequency.  Musculoskeletal: Negative for myalgias and arthralgias.  Skin: Negative for rash.  Neurological: Negative for dizziness, weakness and light-headedness.    Allergies  Codeine; Other; Sulfa antibiotics; and Ancef  Home Medications   Current Outpatient Rx  Name  Route  Sig  Dispense  Refill  . ibuprofen (ADVIL,MOTRIN) 200 MG tablet   Oral   Take 800 mg by mouth daily as needed for pain or headache.         . sertraline (ZOLOFT) 25 MG tablet   Oral   Take  25 mg by mouth daily.         Marland Kitchen ALPRAZolam (XANAX) 0.5 MG tablet   Oral   Take 0.5 mg by mouth daily as needed for sleep or anxiety.         Marland Kitchen doxycycline (VIBRAMYCIN) 100 MG capsule   Oral   Take 1 capsule (100 mg total) by mouth 2 (two) times daily.   20 capsule   0   . HYDROmorphone (DILAUDID) 2 MG tablet   Oral   Take 1 tablet (2 mg total) by mouth every 4 (four) hours as needed for pain.   30 tablet   0   . lidocaine (XYLOCAINE) 2 % jelly   Topical   Apply topically 3 (three) times daily.   30 mL   1    BP 137/80  Pulse 86  Temp(Src) 98.8 F  (37.1 C) (Oral)  Resp 20  SpO2 99% Physical Exam  Nursing note and vitals reviewed. Constitutional: She is oriented to person, place, and time. She appears well-developed and well-nourished. No distress.  HENT:  Head: Normocephalic and atraumatic.  Right Ear: External ear normal.  Left Ear: External ear normal.  Mouth/Throat: Oropharynx is clear and moist. No oropharyngeal exudate.  Eyes: Conjunctivae are normal. Pupils are equal, round, and reactive to light. Right eye exhibits no discharge. Left eye exhibits no discharge.  Neck: Normal range of motion. Neck supple. No JVD present. No thyromegaly present.  Cardiovascular: Normal rate, regular rhythm and normal heart sounds.  Exam reveals no gallop and no friction rub.   No murmur heard. Pulmonary/Chest: Effort normal and breath sounds normal. No respiratory distress. She has no wheezes. She has no rales. She exhibits no tenderness.  Abdominal: Soft. There is no tenderness.  Lymphadenopathy:    She has no cervical adenopathy.  Neurological: She is alert and oriented to person, place, and time. Coordination normal.  Skin: Skin is warm and dry. No rash noted. She is not diaphoretic.  Psychiatric: She has a normal mood and affect. Judgment normal.    ED Course  Procedures (including critical care time) Labs Review Labs Reviewed  CBC WITH DIFFERENTIAL  D-DIMER, QUANTITATIVE   Imaging Review Dg Chest 2 View  10/03/2012   CLINICAL DATA:  47 year old female cough and chest tightness.  EXAM: CHEST  2 VIEW  COMPARISON:  CTA chest 02/06/2004.  FINDINGS: Stable and normal lung volumes. Normal cardiac size and mediastinal contours. Visualized tracheal air column is within normal limits. No pneumothorax, pulmonary edema, pleural effusion or confluent pulmonary opacity. Sequelae of gastric banding. No acute osseous abnormality identified.  IMPRESSION: No acute cardiopulmonary abnormality.   Electronically Signed   By: Augusto Gamble M.D.   On:  10/03/2012 11:51   EKG shows normal sinus rhythm without any evidence of ischemia or strain MDM  No diagnosis found.  Physical exam is entirely normal with the exception of the hoarseness in her voice. CBC is completely normal. EKG is normal. Normal chest x-ray  This patient is concerned about DVT and PE. This is unlikely but it is a legitimate concern given the possible history of unilateral leg swelling with pitting edema, although there is absolutely no evidence of this at this time and no tenderness in either leg. In this patient with normal vitals and low probability PE, a negative d-dimer would make PE very unlikely.  D-dimer is negative. Treat as a viral upper respiratory infection. She'll followup immediately either here in the emergency department if worsening.  Meds ordered this encounter  Medications  . DISCONTD: ipratropium-albuterol (DUONEB) 0.5-2.5 (3) MG/3ML nebulizer solution 3 mL    Sig:   . albuterol (PROVENTIL) (2.5 MG/3ML) 0.083% nebulizer solution    Sig: Take 3 mLs (2.5 mg total) by nebulization every 6 (six) hours as needed for wheezing.    Dispense:  75 mL    Refill:  12    Please administer a one-month supply 3 mL albuterol vials  . methylPREDNISolone (MEDROL DOSEPAK) 4 MG tablet    Sig: Use as directed    Dispense:  21 tablet    Refill:  0  . azithromycin (ZITHROMAX Z-PAK) 250 MG tablet    Sig: Use as directed    Dispense:  6 each    Refill:  0      Graylon Good, PA-C 10/04/12 [phone]

## 2012-10-03 NOTE — ED Notes (Signed)
Lungs clear.  States she feels better.  States congestion has loosened.  In no distress.  Voice raspy.

## 2012-10-05 NOTE — ED Provider Notes (Signed)
Medical screening examination/treatment/procedure(s) were performed by a resident physician or non-physician practitioner and as the supervising physician I was immediately available for consultation/collaboration.  Clementeen Graham, MD    Rodolph Bong, MD 10/05/12 [phone]

## 2013-10-27 ENCOUNTER — Other Ambulatory Visit (HOSPITAL_COMMUNITY): Payer: Self-pay | Admitting: Family Medicine

## 2013-10-27 DIAGNOSIS — Z139 Encounter for screening, unspecified: Secondary | ICD-10-CM

## 2013-10-29 ENCOUNTER — Ambulatory Visit (HOSPITAL_COMMUNITY)
Admission: RE | Admit: 2013-10-29 | Discharge: 2013-10-29 | Disposition: A | Discharge status: Home / Self Care | Payer: 59 | Source: Ambulatory Visit | Attending: Family Medicine | Admitting: Family Medicine

## 2013-10-29 DIAGNOSIS — Z1231 Encounter for screening mammogram for malignant neoplasm of breast: Secondary | ICD-10-CM | POA: Insufficient documentation

## 2013-10-29 DIAGNOSIS — Z139 Encounter for screening, unspecified: Secondary | ICD-10-CM

## 2013-11-02 ENCOUNTER — Other Ambulatory Visit: Payer: Self-pay | Admitting: Family Medicine

## 2013-11-02 DIAGNOSIS — R928 Other abnormal and inconclusive findings on diagnostic imaging of breast: Secondary | ICD-10-CM

## 2013-11-16 ENCOUNTER — Ambulatory Visit (HOSPITAL_COMMUNITY)
Admission: RE | Admit: 2013-11-16 | Discharge: 2013-11-16 | Disposition: A | Discharge status: Home / Self Care | Payer: 59 | Source: Ambulatory Visit | Attending: Family Medicine | Admitting: Family Medicine

## 2013-11-16 ENCOUNTER — Encounter (HOSPITAL_COMMUNITY): Payer: 59

## 2013-11-16 DIAGNOSIS — R928 Other abnormal and inconclusive findings on diagnostic imaging of breast: Secondary | ICD-10-CM

## 2013-11-22 ENCOUNTER — Encounter (HOSPITAL_COMMUNITY): Payer: Self-pay | Admitting: Emergency Medicine

## 2013-11-23 ENCOUNTER — Ambulatory Visit (HOSPITAL_COMMUNITY): Payer: 59

## 2013-12-07 ENCOUNTER — Emergency Department (HOSPITAL_COMMUNITY)
Admission: EM | Admit: 2013-12-07 | Discharge: 2013-12-07 | Disposition: A | Discharge status: Home / Self Care | Payer: 59 | Attending: Emergency Medicine | Admitting: Emergency Medicine

## 2013-12-07 ENCOUNTER — Encounter (HOSPITAL_COMMUNITY): Payer: Self-pay | Admitting: Emergency Medicine

## 2013-12-07 DIAGNOSIS — Y9289 Other specified places as the place of occurrence of the external cause: Secondary | ICD-10-CM | POA: Diagnosis not present

## 2013-12-07 DIAGNOSIS — Z792 Long term (current) use of antibiotics: Secondary | ICD-10-CM | POA: Diagnosis not present

## 2013-12-07 DIAGNOSIS — S61219A Laceration without foreign body of unspecified finger without damage to nail, initial encounter: Secondary | ICD-10-CM

## 2013-12-07 DIAGNOSIS — Z23 Encounter for immunization: Secondary | ICD-10-CM | POA: Diagnosis not present

## 2013-12-07 DIAGNOSIS — W458XXA Other foreign body or object entering through skin, initial encounter: Secondary | ICD-10-CM | POA: Diagnosis not present

## 2013-12-07 DIAGNOSIS — Y9389 Activity, other specified: Secondary | ICD-10-CM | POA: Insufficient documentation

## 2013-12-07 DIAGNOSIS — F419 Anxiety disorder, unspecified: Secondary | ICD-10-CM | POA: Insufficient documentation

## 2013-12-07 DIAGNOSIS — Z79899 Other long term (current) drug therapy: Secondary | ICD-10-CM | POA: Diagnosis not present

## 2013-12-07 DIAGNOSIS — S61214A Laceration without foreign body of right ring finger without damage to nail, initial encounter: Secondary | ICD-10-CM | POA: Diagnosis not present

## 2013-12-07 DIAGNOSIS — Z791 Long term (current) use of non-steroidal anti-inflammatories (NSAID): Secondary | ICD-10-CM | POA: Insufficient documentation

## 2013-12-07 DIAGNOSIS — Y998 Other external cause status: Secondary | ICD-10-CM | POA: Insufficient documentation

## 2013-12-07 MED ORDER — TETANUS-DIPHTH-ACELL PERTUSSIS 5-2.5-18.5 LF-MCG/0.5 IM SUSP
0.5000 mL | Freq: Once | INTRAMUSCULAR | Status: AC
  Administered 2013-12-07: 0.5 mL via INTRAMUSCULAR
  Filled 2013-12-07: qty 0.5

## 2013-12-07 MED ORDER — LIDOCAINE HCL (PF) 2 % IJ SOLN
INTRAMUSCULAR | Status: AC
  Administered 2013-12-07: 17:00:00
  Filled 2013-12-07: qty 10

## 2013-12-07 NOTE — ED Notes (Signed)
Pt reports she was washing a glass that broke, cutting her R ring finger.

## 2013-12-07 NOTE — ED Provider Notes (Signed)
CSN: [id number]     Arrival date & time 12/07/13  1511 History   First MD Initiated Contact with Patient 12/07/13 1624     Chief Complaint  Patient presents with  . Extremity Laceration     (Consider location/radiation/quality/duration/timing/severity/associated sxs/prior Treatment) Patient is a 48 y.o. female presenting with hand pain. The history is provided by the patient. No language interpreter was used.  Hand Pain This is a new problem. The current episode started today. The problem occurs constantly. The problem has been unchanged. Pertinent negatives include no joint swelling or myalgias. Nothing aggravates the symptoms. She has tried nothing for the symptoms. The treatment provided no relief.  Pt reports she cut finger washing a glass.  Large piece of glass.  No small foreign bodies.    Past Medical History  Diagnosis Date  . Medical history non-contributory   . Anxiety    Past Surgical History  Procedure Laterality Date  . Cesarean section    . Abdominal hysterectomy    . Appendectomy    . Laparoscopic gastric banding     Family History  Problem Relation Age of Onset  . Cancer Maternal Grandmother     breast  . Transient ischemic attack Maternal Grandmother   . Cancer Maternal Grandfather     lung  . Cancer Paternal Grandmother     breast  . Diabetes Paternal Grandfather   . Heart disease Paternal Grandfather    History  Substance Use Topics  . Smoking status: Never Smoker   . Smokeless tobacco: Never Used  . Alcohol Use: Yes     Comment: occasional   OB History    Gravida Para Term Preterm AB TAB SAB Ectopic Multiple Living   4 4 2 2      4      Review of Systems  Musculoskeletal: Negative for myalgias and joint swelling.  All other systems reviewed and are negative.     Allergies  Codeine; Other; Sulfa antibiotics; and Ancef  Home Medications   Prior to Admission medications   Medication Sig Start Date End Date Taking? Authorizing Provider   albuterol (PROVENTIL) (2.5 MG/3ML) 0.083% nebulizer solution Take 3 mLs (2.5 mg total) by nebulization every 6 (six) hours as needed for wheezing. 10/03/12   Graylon GoodZachary H Baker, PA-C  ALPRAZolam Prudy Feeler(XANAX) 0.5 MG tablet Take 0.5 mg by mouth daily as needed for sleep or anxiety.    Historical Provider, MD  azithromycin (ZITHROMAX Z-PAK) 250 MG tablet Use as directed 10/03/12   Graylon GoodZachary H Baker, PA-C  doxycycline (VIBRAMYCIN) 100 MG capsule Take 1 capsule (100 mg total) by mouth 2 (two) times daily. 04/16/12   Currie Pariserri L Burleson, NP  HYDROmorphone (DILAUDID) 2 MG tablet Take 1 tablet (2 mg total) by mouth every 4 (four) hours as needed for pain. 04/16/12   Currie Pariserri L Burleson, NP  ibuprofen (ADVIL,MOTRIN) 200 MG tablet Take 800 mg by mouth daily as needed for pain or headache.    Historical Provider, MD  lidocaine (XYLOCAINE) 2 % jelly Apply topically 3 (three) times daily. 04/16/12   Currie Pariserri L Burleson, NP  methylPREDNISolone (MEDROL DOSEPAK) 4 MG tablet Use as directed 10/03/12   Graylon GoodZachary H Baker, PA-C  sertraline (ZOLOFT) 25 MG tablet Take 25 mg by mouth daily.    Historical Provider, MD   BP 144/91 mmHg  Pulse 95  Temp(Src) 98.4 F (36.9 C) (Oral)  Resp 16  Ht 5\' 5"  (1.651 m)  Wt 275 lb (124.739 kg)  BMI 45.76 kg/m2  SpO2 98% Physical Exam  Constitutional: She is oriented to person, place, and time. She appears well-developed and well-nourished.  HENT:  Head: Normocephalic.  Eyes: EOM are normal.  Neck: Normal range of motion.  Musculoskeletal: She exhibits tenderness.  8mm u shaped flap laceration right ring finger,  From  nv and ns intact  Neurological: She is alert and oriented to person, place, and time.  Skin: No erythema.  Psychiatric: She has a normal mood and affect.  Nursing note and vitals reviewed.   ED Course  LACERATION REPAIR Date/Time: 12/07/2013 4:54 PM Performed by: Elson AreasSOFIA, LESLIE K Authorized by: Elson AreasSOFIA, LESLIE K Consent: Verbal consent not obtained. Risks and benefits: risks,  benefits and alternatives were discussed Consent given by: patient Required items: required blood products, implants, devices, and special equipment available Preparation: Patient was prepped and draped in the usual sterile fashion. Debridement: none Degree of undermining: none Skin closure: glue Approximation: close Approximation difficulty: simple Patient tolerance: Patient tolerated the procedure well with no immediate complications   (including critical care time) Labs Review Labs Reviewed - No data to display  Imaging Review No results found.   EKG Interpretation None      MDM   Final diagnoses:  Laceration of finger, initial encounter    Pt counseled on possible foreign bodies and risk of infection AVS     Elson AreasLeslie K Sofia, PA-C 12/07/13 1657  Linwood DibblesJon Knapp, MD 12/07/13 Rickey [id number]

## 2013-12-07 NOTE — Discharge Instructions (Signed)

## 2013-12-07 NOTE — ED Notes (Signed)
Pt states the glass broke in a line, no fragmentation of the glass.

## 2014-01-10 ENCOUNTER — Encounter: Payer: Self-pay | Admitting: Gastroenterology

## 2014-02-20 ENCOUNTER — Emergency Department (HOSPITAL_COMMUNITY): Payer: 59

## 2014-02-20 ENCOUNTER — Emergency Department (HOSPITAL_COMMUNITY)
Admission: EM | Admit: 2014-02-20 | Discharge: 2014-02-20 | Disposition: A | Discharge status: Home / Self Care | Payer: 59 | Attending: Emergency Medicine | Admitting: Emergency Medicine

## 2014-02-20 ENCOUNTER — Encounter (HOSPITAL_COMMUNITY): Payer: Self-pay | Admitting: Cardiology

## 2014-02-20 DIAGNOSIS — F419 Anxiety disorder, unspecified: Secondary | ICD-10-CM | POA: Diagnosis not present

## 2014-02-20 DIAGNOSIS — Z792 Long term (current) use of antibiotics: Secondary | ICD-10-CM | POA: Insufficient documentation

## 2014-02-20 DIAGNOSIS — R079 Chest pain, unspecified: Secondary | ICD-10-CM

## 2014-02-20 DIAGNOSIS — Z791 Long term (current) use of non-steroidal anti-inflammatories (NSAID): Secondary | ICD-10-CM | POA: Insufficient documentation

## 2014-02-20 DIAGNOSIS — Z79899 Other long term (current) drug therapy: Secondary | ICD-10-CM | POA: Insufficient documentation

## 2014-02-20 DIAGNOSIS — R0789 Other chest pain: Secondary | ICD-10-CM | POA: Insufficient documentation

## 2014-02-20 LAB — BASIC METABOLIC PANEL
Anion gap: 10 (ref 5–15)
BUN: 8 mg/dL (ref 6–23)
CO2: 23 mmol/L (ref 19–32)
CREATININE: 0.7 mg/dL (ref 0.50–1.10)
Calcium: 9 mg/dL (ref 8.4–10.5)
Chloride: 102 mmol/L (ref 96–112)
GFR calc Af Amer: >90 mL/min (ref >90)
Glucose, Bld: 167 mg/dL — ABNORMAL HIGH (ref 70–99)
POTASSIUM: 3.9 mmol/L (ref 3.5–5.1)
Sodium: 135 mmol/L (ref 135–145)

## 2014-02-20 LAB — CBC
HEMATOCRIT: 41.9 % (ref 36.0–46.0)
Hemoglobin: 14.4 g/dL (ref 12.0–15.0)
MCH: 31 pg (ref 26.0–34.0)
MCHC: 34.4 g/dL (ref 30.0–36.0)
MCV: 90.3 fL (ref 78.0–100.0)
PLATELETS: 282 10*3/uL (ref 150–400)
RBC: 4.64 MIL/uL (ref 3.87–5.11)
RDW: 12.5 % (ref 11.5–15.5)
WBC: 8 10*3/uL (ref 4.0–10.5)

## 2014-02-20 LAB — I-STAT TROPONIN, ED
Troponin i, poc: 0 ng/mL (ref 0.00–0.08)
Troponin i, poc: 0 ng/mL (ref 0.00–0.08)

## 2014-02-20 LAB — D-DIMER, QUANTITATIVE (NOT AT ARMC): D DIMER QUANT: 0.47 ug{FEU}/mL (ref 0.00–0.48)

## 2014-02-20 MED ORDER — ASPIRIN 81 MG PO CHEW
162.0000 mg | CHEWABLE_TABLET | Freq: Once | ORAL | Status: AC
  Administered 2014-02-20: 162 mg via ORAL
  Filled 2014-02-20: qty 2

## 2014-02-20 NOTE — Discharge Instructions (Signed)

## 2014-02-20 NOTE — ED Notes (Signed)
Pt reports fatigue over the past couple of days and reports she had sharp chest pain that radiated to the left arm. Pt took 2 baby ASA PTA. States some SOB.

## 2014-02-20 NOTE — ED Provider Notes (Signed)
CSN: [id number]     Arrival date & time 02/20/14  1434 History   First MD Initiated Contact with Patient 02/20/14 1621     Chief Complaint  Patient presents with  . Chest Pain    Patient is a 49 y.o. female presenting with chest pain. The history is provided by the patient.  Chest Pain Pain location:  Substernal area Pain quality: sharp   Pain radiates to:  L arm Pain radiates to the back: no   Pain severity:  Moderate Onset quality:  Sudden Duration:  2 hours Timing:  Constant Progression:  Resolved Chronicity:  New Relieved by:  Nothing Worsened by:  Nothing tried Associated symptoms: fatigue and shortness of breath   Associated symptoms: no abdominal pain, no cough, no fever, no syncope and not vomiting   Risk factors: obesity   Risk factors: no coronary artery disease, no diabetes mellitus, no high cholesterol, no hypertension, no prior DVT/PE and no smoking   Patient reports sudden onset of sharp CP about 2 hrs ago.  It lasted several minutes and resolved She reports associated SOB She reports recent "chest heaviness" but none over past 24 hours She denies known h/o CAD/PE/DVT She reports recent fatigue and increased stress due to work   Past Medical History  Diagnosis Date  . Medical history non-contributory   . Anxiety    Past Surgical History  Procedure Laterality Date  . Cesarean section    . Abdominal hysterectomy    . Appendectomy    . Laparoscopic gastric banding     Family History  Problem Relation Age of Onset  . Cancer Maternal Grandmother     breast  . Transient ischemic attack Maternal Grandmother   . Cancer Maternal Grandfather     lung  . Cancer Paternal Grandmother     breast  . Diabetes Paternal Grandfather   . Heart disease Paternal Grandfather    History  Substance Use Topics  . Smoking status: Never Smoker   . Smokeless tobacco: Never Used  . Alcohol Use: Yes     Comment: occasional   OB History    Gravida Para Term Preterm AB  TAB SAB Ectopic Multiple Living   Review of Systems  Constitutional: Positive for fatigue. Negative for fever.  Respiratory: Positive for shortness of breath. Negative for cough.   Cardiovascular: Positive for chest pain. Negative for syncope.  Gastrointestinal: Negative for vomiting and abdominal pain.  Neurological: Negative for syncope.  All other systems reviewed and are negative.     Allergies  Codeine; Other; Sulfa antibiotics; and Ancef  Home Medications   Prior to Admission medications   Medication Sig Start Date End Date Taking? Authorizing Provider  albuterol (PROVENTIL) (2.5 MG/3ML) 0.083% nebulizer solution Take 3 mLs (2.5 mg total) by nebulization every 6 (six) hours as needed for wheezing. 10/03/12   Graylon Good, PA-C  ALPRAZolam Prudy Feeler) 0.5 MG tablet Take 0.5 mg by mouth daily as needed for sleep or anxiety.    Historical Provider, MD  azithromycin (ZITHROMAX Z-PAK) 250 MG tablet Use as directed 10/03/12   Graylon Good, PA-C  doxycycline (VIBRAMYCIN) 100 MG capsule Take 1 capsule (100 mg total) by mouth 2 (two) times daily. 04/16/12   Currie Paris, NP  HYDROmorphone (DILAUDID) 2 MG tablet Take 1 tablet (2 mg total) by mouth every 4 (four) hours as needed for pain. 04/16/12   Terri L  Burleson, NP  ibuprofen (ADVIL,MOTRIN) 200 MG tablet Take 800 mg by mouth daily as needed for pain or headache.    Historical Provider, MD  lidocaine (XYLOCAINE) 2 % jelly Apply topically 3 (three) times daily. 04/16/12   Currie Paris, NP  methylPREDNISolone (MEDROL DOSEPAK) 4 MG tablet Use as directed 10/03/12   Graylon Good, PA-C  sertraline (ZOLOFT) 25 MG tablet Take 25 mg by mouth daily.    Historical Provider, MD   BP 137/66 mmHg  Pulse 83  Temp(Src) 98 F (36.7 C) (Oral)  Resp 18  Wt 275 lb (124.739 kg)  SpO2 98% Physical Exam CONSTITUTIONAL: Well developed/well nourished HEAD: Normocephalic/atraumatic EYES: EOMI/PERRL ENMT: Mucous membranes  moist NECK: supple no meningeal signs SPINE/BACK:entire spine nontender CV: S1/S2 noted, no murmurs/rubs/gallops noted Chest -no chest wall tenderness LUNGS: Lungs are clear to auscultation bilaterally, no apparent distress ABDOMEN: soft, nontender, no rebound or guarding, bowel sounds noted throughout abdomen GU:no cva tenderness NEURO: Pt is awake/alert/appropriate, moves all extremitiesx4.  No facial droop.   EXTREMITIES: pulses normal/equal, full ROM. Chronic pitting edema to LE SKIN: warm, color normal PSYCH: no abnormalities of mood noted, alert and oriented to situation  ED Course  Procedures   5:12 PM HEART score - 2 (low suspicion, 2 risk factors - obesity/fam history) Will repeat troponin/ekg at 3 hr mark Gave an additiional 2 ASA (she had 2 at home) 5:32 PM D-dimer negative Initial EKG/troponin negative Will repeat at 1930 Pt CP free at this time 8:56 PM Repeat troponin negative Pt well appearing I feel she is safe/appropriate for d/c home BP 130/59 mmHg  Pulse 80  Temp(Src) 98 F (36.7 C) (Oral)  Resp 10  Wt 275 lb (124.739 kg)  SpO2 99%  Labs Review Labs Reviewed  BASIC METABOLIC PANEL - Abnormal; Notable for the following:    Glucose, Bld 167 (*)    All other components within normal limits  CBC  D-DIMER, QUANTITATIVE  I-STAT TROPOININ, ED  I-STAT TROPOININ, ED    Imaging Review Dg Chest 2 View  02/20/2014   CLINICAL DATA:  Pt had a sharp chest pain at 2pm that has faded a little bit and is now currently feeling slight pressure with dizziness. Pt states she had right sided jaw pain yesterday that is not bothering her today. Pt says she normally has chest pain from stress but today it was different than she has felt before. No previous cardiopulmonary problems  EXAM: CHEST  2 VIEW  COMPARISON:  10/03/2012  FINDINGS: Cardiac silhouette normal in size and configuration. Normal mediastinal and hilar contours. Lungs are clear. No pleural effusion or  pneumothorax. The bony thorax is intact.  IMPRESSION: No active cardiopulmonary disease.   Electronically Signed   By: Amie Portland M.D.   On: 02/20/2014 15:36     EKG Interpretation   Date/Time:  Sunday February 20 2014 14:38:56 EST Ventricular Rate:  105 PR Interval:  152 QRS Duration: 76 QT Interval:  342 QTC Calculation: 452 R Axis:   47 Text Interpretation:  Sinus tachycardia Cannot rule out Anterior infarct ,  age undetermined Abnormal ECG No significant change since last tracing  artifact noted Confirmed by Bebe Shaggy  MD, Hiren Peplinski ([id number]) on 02/20/2014  4:22:06 PM      EKG Interpretation  Date/Time:  Sunday February 20 2014 19:24:53 EST Ventricular Rate:  75 PR Interval:  149 QRS Duration: 82 QT Interval:  386 QTC Calculation: 431 R Axis:   53 Text Interpretation:  Sinus  rhythm Anteroseptal infarct, old No significant change since last tracing Confirmed by [facility]  MD, Lyza Houseworth ([id number]) on 02/20/2014 7:29:44 PM      Medications  aspirin chewable tablet 162 mg (162 mg Oral Given 02/20/14 1651)    MDM   Final diagnoses:  Chest pain, unspecified chest pain type    Nursing notes including past medical history and social history reviewed and considered in documentation xrays/imaging reviewed by myself and considered during evaluation Labs/vital reviewed myself and considered during evaluation     Joya Gaskinsonald W Korie Brabson, MD 02/20/14 2056

## 2014-06-08 ENCOUNTER — Other Ambulatory Visit: Payer: Self-pay | Admitting: Physician Assistant

## 2015-02-14 ENCOUNTER — Encounter (HOSPITAL_COMMUNITY): Payer: Self-pay | Admitting: Emergency Medicine

## 2015-02-14 ENCOUNTER — Emergency Department (HOSPITAL_COMMUNITY)
Admission: EM | Admit: 2015-02-14 | Discharge: 2015-02-15 | Disposition: A | Discharge status: Home / Self Care | Payer: 59 | Attending: Emergency Medicine | Admitting: Emergency Medicine

## 2015-02-14 DIAGNOSIS — N390 Urinary tract infection, site not specified: Secondary | ICD-10-CM

## 2015-02-14 DIAGNOSIS — F419 Anxiety disorder, unspecified: Secondary | ICD-10-CM | POA: Diagnosis not present

## 2015-02-14 DIAGNOSIS — R110 Nausea: Secondary | ICD-10-CM | POA: Diagnosis present

## 2015-02-14 LAB — URINALYSIS, ROUTINE W REFLEX MICROSCOPIC
Glucose, UA: NEGATIVE mg/dL
NITRITE: POSITIVE — AB
PH: 5.5 (ref 5.0–8.0)
Protein, ur: 100 mg/dL — AB
SPECIFIC GRAVITY, URINE: 1.025 (ref 1.005–1.030)

## 2015-02-14 LAB — URINE MICROSCOPIC-ADD ON

## 2015-02-14 MED ORDER — OXYCODONE-ACETAMINOPHEN 5-325 MG PO TABS
1.0000 | ORAL_TABLET | Freq: Once | ORAL | Status: AC
  Administered 2015-02-14: 1 via ORAL
  Filled 2015-02-14: qty 1

## 2015-02-14 MED ORDER — ONDANSETRON 8 MG PO TBDP
8.0000 mg | ORAL_TABLET | Freq: Once | ORAL | Status: AC
  Administered 2015-02-14: 8 mg via ORAL
  Filled 2015-02-14: qty 1

## 2015-02-14 MED ORDER — PHENAZOPYRIDINE HCL 100 MG PO TABS
200.0000 mg | ORAL_TABLET | Freq: Once | ORAL | Status: AC
  Administered 2015-02-15: 200 mg via ORAL
  Filled 2015-02-14: qty 2

## 2015-02-14 MED ORDER — OXYCODONE-ACETAMINOPHEN 5-325 MG PO TABS
1.0000 | ORAL_TABLET | Freq: Once | ORAL | Status: AC
  Administered 2015-02-15: 1 via ORAL
  Filled 2015-02-14: qty 1

## 2015-02-14 MED ORDER — CIPROFLOXACIN HCL 250 MG PO TABS
500.0000 mg | ORAL_TABLET | Freq: Once | ORAL | Status: AC
  Administered 2015-02-14: 500 mg via ORAL
  Filled 2015-02-14: qty 2

## 2015-02-14 NOTE — ED Notes (Signed)
Pt c/o pressure when she urinates and has been nauseated all day.

## 2015-02-14 NOTE — ED Provider Notes (Signed)
CSN: [id number]     Arrival date & time 02/14/15  2236 History  By signing my name below, I, [facility], attest that this documentation has been prepared under the direction and in the presence of Zadie Rhine, MD. Electronically Signed: Randell Patient, ED Scribe. 02/14/2015. 11:21 PM.   Chief Complaint  Patient presents with  . Nausea   HPI Comments: Patient reports nausea onset this morning upon waking. She endorses frequency and pressure at the end of the stream when urinating earlier this evening, followed by urgency, hematuria, suprapubic abdominal pain, and gradually worsening dysuria in the past few hours. She has taken ibuprofen without relief. Per patient, she has an hx of DM that is well controlled and takes Metformin, Hx of hysterectomy and bladder infections. She notes similar symptoms in the past that presented with a bladder infection. Denies hx of kidney surgeries. Patient denies vomiting, fevers, back pain, SOB, and HA.  Patient is a 50 y.o. female presenting with abdominal pain. The history is provided by the patient. No language interpreter was used.  Abdominal Pain Pain location:  Suprapubic Pain radiates to:  Does not radiate Pain severity:  Moderate Onset quality:  Gradual Timing:  Constant Progression:  Worsening Chronicity:  New Associated symptoms: dysuria, hematuria and nausea   Associated symptoms: no fever, no shortness of breath and no vomiting   Dysuria:    Severity:  Mild   Onset quality:  Gradual   Timing:  Intermittent   Chronicity:  New   Past Medical History  Diagnosis Date  . Medical history non-contributory   . Anxiety    Past Surgical History  Procedure Laterality Date  . Cesarean section    . Abdominal hysterectomy    . Appendectomy    . Laparoscopic gastric banding     Family History  Problem Relation Age of Onset  . Cancer Maternal Grandmother     breast  . Transient ischemic attack Maternal Grandmother   . Cancer  Maternal Grandfather     lung  . Cancer Paternal Grandmother     breast  . Diabetes Paternal Grandfather   . Heart disease Paternal Grandfather    Social History  Substance Use Topics  . Smoking status: Never Smoker   . Smokeless tobacco: Never Used  . Alcohol Use: Yes     Comment: occasional   OB History    Gravida Para Term Preterm AB TAB SAB Ectopic Multiple Living   Review of Systems  Constitutional: Negative for fever.  Respiratory: Negative for shortness of breath.   Gastrointestinal: Positive for nausea and abdominal pain. Negative for vomiting.  Genitourinary: Positive for dysuria, urgency, frequency and hematuria.  Musculoskeletal: Negative for back pain.  Neurological: Negative for headaches.  All other systems reviewed and are negative.     Allergies  Codeine; Other; Sulfa antibiotics; and Ancef  Home Medications   Prior to Admission medications   Medication Sig Start Date End Date Taking? Authorizing Provider  albuterol (PROVENTIL) (2.5 MG/3ML) 0.083% nebulizer solution Take 3 mLs (2.5 mg total) by nebulization every 6 (six) hours as needed for wheezing. 10/03/12  Yes Graylon Good, PA-C  ALPRAZolam Prudy Feeler) 0.5 MG tablet Take 0.5 mg by mouth daily as needed for sleep or anxiety.   Yes Historical Provider, MD  losartan-hydrochlorothiazide (HYZAAR) 100-25 MG tablet TAKE ONE TABLET BY MOUTH ONCE DAILY   Yes Historical Provider, MD  metFORMIN (GLUCOPHAGE) 500  MG tablet Take 1,000 mg by mouth every morning.    Yes Historical Provider, MD  ibuprofen (ADVIL,MOTRIN) 200 MG tablet Take 800 mg by mouth daily as needed for pain or headache.    Historical Provider, MD   BP 144/74 mmHg  Pulse 101  Temp(Src) 99.1 F (37.3 C) (Temporal)  Resp 18  Ht  (1.651 m)  Wt 259 lb (117.482 kg)  BMI 43.10 kg/m2  SpO2 98% Physical Exam CONSTITUTIONAL: Well developed/well nourished HEAD: Normocephalic/atraumatic ENMT: Mucous membranes moist NECK:  supple no meningeal signs SPINE/BACK:entire spine nontender CV: S1/S2 noted, no murmurs/rubs/gallops noted LUNGS: Lungs are clear to auscultation bilaterally, no apparent distress ABDOMEN: soft, mild suprapubic tenderness, no rebound or guarding, bowel sounds noted throughout abdomen GU:no cva tenderness NEURO: Pt is awake/alert/appropriate, moves all extremitiesx4.  No facial droop.   EXTREMITIES: pulses normal/equal, full ROM SKIN: warm, color normal PSYCH: no abnormalities of mood noted, alert and oriented to situation  ED Course  Procedures   DIAGNOSTIC STUDIES: Oxygen Saturation is 98% on RA, normal by my interpretation.    COORDINATION OF CARE: 11:04 PM Will order Cipro, pain medication, and antiemesis medication. Discussed treatment plan with pt at bedside and pt agreed to plan.  Pt well appearing She is nontoxic She is not septic appearing Stable for d/c home for UTI treatment Discussed strict return precautions with patient   Labs Review Labs Reviewed  URINALYSIS, ROUTINE W REFLEX MICROSCOPIC (NOT AT [facility]) - Abnormal; Notable for the following:    Color, Urine BROWN (*)    APPearance CLOUDY (*)    Hgb urine dipstick LARGE (*)    Bilirubin Urine SMALL (*)    Ketones, ur TRACE (*)    Protein, ur 100 (*)    Nitrite POSITIVE (*)    Leukocytes, UA MODERATE (*)    All other components within normal limits  URINE MICROSCOPIC-ADD ON - Abnormal; Notable for the following:    Squamous Epithelial / LPF 0-5 (*)    Bacteria, UA FEW (*)    All other components within normal limits    I have personally reviewed and evaluated these  lab results as part of my medical decision-making.  Medications  ciprofloxacin (CIPRO) tablet 500 mg (500 mg Oral Given 02/14/15 2319)  ondansetron (ZOFRAN-ODT) disintegrating tablet 8 mg (8 mg Oral Given 02/14/15 2318)  oxyCODONE-acetaminophen (PERCOCET/ROXICET) 5-325 MG per tablet 1 tablet (1 tablet Oral Given 02/14/15 2319)  phenazopyridine  (PYRIDIUM) tablet 200 mg (200 mg Oral Given 02/15/15 0004)  oxyCODONE-acetaminophen (PERCOCET/ROXICET) 5-325 MG per tablet 1 tablet (1 tablet Oral Given 02/15/15 0004)    MDM   Final diagnoses:  UTI (lower urinary tract infection)    Nursing notes including past medical history and social history reviewed and considered in documentation Labs/vital reviewed myself and considered during evaluation    I personally performed the services described in this documentation, which was scribed in my presence. The recorded information has been reviewed and is accurate.      Zadie Rhine, MD 02/15/15 0040

## 2015-02-15 MED ORDER — ONDANSETRON 8 MG PO TBDP: 8.0000 mg | ORAL_TABLET | Freq: Three times a day (TID) | ORAL | Status: DC | PRN

## 2015-02-15 MED ORDER — CIPROFLOXACIN HCL 500 MG PO TABS: 500.0000 mg | ORAL_TABLET | Freq: Two times a day (BID) | ORAL | Status: DC

## 2015-02-15 MED ORDER — PHENAZOPYRIDINE HCL 200 MG PO TABS: 200.0000 mg | ORAL_TABLET | Freq: Three times a day (TID) | ORAL | Status: DC

## 2015-02-15 NOTE — ED Notes (Signed)
Pt alert & oriented x4, stable gait. Patient given discharge instructions, paperwork & prescription(s). Patient  instructed to stop at the registration desk to finish any additional paperwork. Patient verbalized understanding. Pt left department w/ no further questions. 

## 2015-09-14 ENCOUNTER — Ambulatory Visit (INDEPENDENT_AMBULATORY_CARE_PROVIDER_SITE_OTHER): Payer: 59

## 2015-09-14 ENCOUNTER — Encounter (HOSPITAL_COMMUNITY): Payer: Self-pay | Admitting: Emergency Medicine

## 2015-09-14 ENCOUNTER — Ambulatory Visit (HOSPITAL_COMMUNITY)
Admission: EM | Admit: 2015-09-14 | Discharge: 2015-09-14 | Disposition: A | Discharge status: Home / Self Care | Payer: 59 | Attending: Emergency Medicine | Admitting: Emergency Medicine

## 2015-09-14 DIAGNOSIS — H6692 Otitis media, unspecified, left ear: Secondary | ICD-10-CM

## 2015-09-14 DIAGNOSIS — J189 Pneumonia, unspecified organism: Secondary | ICD-10-CM | POA: Diagnosis not present

## 2015-09-14 MED ORDER — AMOXICILLIN-POT CLAVULANATE 875-125 MG PO TABS: 1.0000 | ORAL_TABLET | Freq: Two times a day (BID) | ORAL | 0 refills | Status: DC

## 2015-09-14 MED ORDER — METHYLPREDNISOLONE ACETATE 80 MG/ML IJ SUSP
80.0000 mg | Freq: Once | INTRAMUSCULAR | Status: AC
  Administered 2015-09-14: 80 mg via INTRAMUSCULAR

## 2015-09-14 MED ORDER — PREDNISONE 20 MG PO TABS: ORAL_TABLET | ORAL | 0 refills | Status: DC

## 2015-09-14 MED ORDER — METHYLPREDNISOLONE ACETATE 80 MG/ML IJ SUSP
INTRAMUSCULAR | Status: AC
  Filled 2015-09-14: qty 1

## 2015-09-14 MED ORDER — BENZONATATE 100 MG PO CAPS: 100.0000 mg | ORAL_CAPSULE | Freq: Three times a day (TID) | ORAL | 0 refills | Status: DC

## 2015-09-14 NOTE — ED Triage Notes (Signed)
[facility] a [facility] shortness of breath x 10 days. The patient stated that she saw the Virtual Provider and was prescribed a z-pack and pro-air inhaler. She stated that she finished the z-pack 2 days ago and used the inhaler this am but she does not feel any better.

## 2015-09-14 NOTE — ED Provider Notes (Signed)
CSN: [id number]     Arrival date & time 09/14/15  1047 History   First MD Initiated Contact with Patient 09/14/15 1129     Chief Complaint  Patient presents with  . Shortness of Breath   (Consider location/radiation/quality/duration/timing/severity/associated sxs/prior Treatment) HPI [name] is a 50 y.o. female presenting to UC with c/o persistent cough and congestion for 10 days.  She was prescribed Azithromycin through a telemedicine visit, completed 2 days ago and has been using her albuterol inhaler, old tessalon pearls and mucinex w/o relief. She notes others in her family had similar symptoms but they got better within just a few days even without antibiotics.  Denies fever, chills, n/v/d.    Past Medical History:  Diagnosis Date  . Anxiety   . Medical history non-contributory    Past Surgical History:  Procedure Laterality Date  . ABDOMINAL HYSTERECTOMY    . APPENDECTOMY    . CESAREAN SECTION    . LAPAROSCOPIC GASTRIC BANDING     Family History  Problem Relation Age of Onset  . Cancer Maternal Grandmother     breast  . Transient ischemic attack Maternal Grandmother   . Cancer Maternal Grandfather     lung  . Cancer Paternal Grandmother     breast  . Diabetes Paternal Grandfather   . Heart disease Paternal Grandfather    Social History  Substance Use Topics  . Smoking status: Never Smoker  . Smokeless tobacco: Never Used  . Alcohol use Yes     Comment: occasional   OB History    Gravida Para Term Preterm AB Living   4 4 2 2   4    SAB TAB Ectopic Multiple Live Births           3     Review of Systems  Constitutional: Negative for chills, fatigue and fever.  HENT: Positive for congestion, ear pain ( bilateral), postnasal drip, rhinorrhea and sinus pressure. Negative for sore throat.   Respiratory: Positive for cough, chest tightness and shortness of breath. Negative for wheezing.   Gastrointestinal: Negative for diarrhea, nausea and vomiting.   Neurological: Positive for headaches. Negative for dizziness and light-headedness.    Allergies  Codeine; Other; Sulfa antibiotics; and Ancef [cefazolin]  Home Medications   Prior to Admission medications   Medication Sig Start Date End Date Taking? Authorizing Provider  albuterol (PROVENTIL) (2.5 MG/3ML) 0.083% nebulizer solution Take 3 mLs (2.5 mg total) by nebulization every 6 (six) hours as needed for wheezing. 10/03/12   Graylon Good, PA-C  ALPRAZolam Prudy Feeler) 0.5 MG tablet Take 0.5 mg by mouth daily as needed for sleep or anxiety.    Historical Provider, MD  amoxicillin-clavulanate (AUGMENTIN) 875-125 MG tablet Take 1 tablet by mouth 2 (two) times daily. One po bid x 7 days 09/14/15   Junius Finner, PA-C  benzonatate (TESSALON) 100 MG capsule Take 1 capsule (100 mg total) by mouth every 8 (eight) hours. 09/14/15   Junius Finner, PA-C  ciprofloxacin (CIPRO) 500 MG tablet Take 1 tablet (500 mg total) by mouth 2 (two) times daily. One po bid x 7 days 02/15/15   Zadie Rhine, MD  ibuprofen (ADVIL,MOTRIN) 200 MG tablet Take 800 mg by mouth daily as needed for pain or headache.    Historical Provider, MD  losartan-hydrochlorothiazide (HYZAAR) 100-25 MG tablet TAKE ONE TABLET BY MOUTH ONCE DAILY    Historical Provider, MD  metFORMIN (GLUCOPHAGE) 500 MG tablet Take 1,000 mg by mouth every morning.  Historical Provider, MD  ondansetron (ZOFRAN ODT) 8 MG disintegrating tablet Take 1 tablet (8 mg total) by mouth every 8 (eight) hours as needed. 02/15/15   Zadie Rhineonald Wickline, MD  phenazopyridine (PYRIDIUM) 200 MG tablet Take 1 tablet (200 mg total) by mouth 3 (three) times daily. 02/15/15   Zadie Rhineonald Wickline, MD  predniSONE (DELTASONE) 20 MG tablet 3 tabs po day one, then 2 po daily x 4 days 09/14/15   Junius FinnerErin O'Malley, PA-C   Meds Ordered and Administered this Visit   Medications  methylPREDNISolone acetate (DEPO-MEDROL) injection 80 mg (80 mg Intramuscular Given 09/14/15 1153)    BP 159/81 (BP  Location: Left Arm)   Pulse 79   Temp 97.8 F (36.6 C) (Oral)   Resp 16   SpO2 97%  No data found.   Physical Exam  Constitutional: She appears well-developed and well-nourished. No distress.  Pt sitting on exam bed, appears mildly fatigued but is alert. NAD  HENT:  Head: Normocephalic and atraumatic.  Right Ear: Tympanic membrane normal.  Left Ear: Tympanic membrane is erythematous and bulging.  Nose: Rhinorrhea present. Right sinus exhibits maxillary sinus tenderness and frontal sinus tenderness. Left sinus exhibits maxillary sinus tenderness and frontal sinus tenderness.  Mouth/Throat: Uvula is midline, oropharynx is clear and moist and mucous membranes are normal.  Eyes: Conjunctivae are normal. No scleral icterus.  Neck: Normal range of motion.  Cardiovascular: Normal rate, regular rhythm and normal heart sounds.   Pulmonary/Chest: Effort normal. No respiratory distress. She has wheezes in the right lower field. She has rhonchi in the right lower field. She has no rales.  Abdominal: Soft. She exhibits no distension. There is no tenderness.  Musculoskeletal: Normal range of motion.  Neurological: She is alert.  Skin: Skin is warm and dry. She is not diaphoretic.  Nursing note and vitals reviewed.   Urgent Care Course   Clinical Course    Procedures (including critical care time)  Labs Review Labs Reviewed - No data to display  Imaging Review Dg Chest 2 View  Result Date: 09/14/2015 CLINICAL DATA:  Cough for 10 days, wheezing, fever and chills EXAM: CHEST  2 VIEW COMPARISON:  Chest x-ray of 02/20/2014 FINDINGS: Pulmonary markings at the right lung base are minimally more prominent and developing pneumonia cannot be excluded. Some this opacity could be due to overlapping breast tissue and vasculature. No pleural effusion is seen. Mediastinal and hilar contours are unremarkable. The heart is within normal limits in size. A lap band is noted. No bony abnormality is seen.  IMPRESSION: Minimally prominent markings at the right lung base could indicate patchy pneumonia. Correlate clinically. Electronically Signed   By: Dwyane DeePaul  Barry M.D.   On: 09/14/2015 12:02    MDM   1. CAP (community acquired pneumonia)   2. Acute left otitis media, recurrence not specified, unspecified otitis media type    Pt c/o persistent cough with congestion and fatigue despite completing a course of azithromycin.  Wheeze and rhonchi noted in RLL field. CXR: questionable for pneumonia, will treat CAP  Rx: Augmentin (has had before w/o reaction), prednisone and tessalon Advised pt to use acetaminophen and ibuprofen as needed for fever and pain. Encouraged rest and fluids. F/u with PCP in 7-10 days if not improving, sooner if worsening. Pt verbalized understanding and agreement with tx plan.       Junius Finnerrin O'Malley, PA-C 09/14/15 1311

## 2015-12-04 ENCOUNTER — Other Ambulatory Visit (HOSPITAL_COMMUNITY): Payer: Self-pay | Admitting: Family Medicine

## 2015-12-04 DIAGNOSIS — Z1231 Encounter for screening mammogram for malignant neoplasm of breast: Secondary | ICD-10-CM

## 2015-12-08 ENCOUNTER — Ambulatory Visit (HOSPITAL_COMMUNITY)
Admission: RE | Admit: 2015-12-08 | Discharge: 2015-12-08 | Disposition: A | Discharge status: Home / Self Care | Payer: 59 | Source: Ambulatory Visit | Attending: Family Medicine | Admitting: Family Medicine

## 2015-12-08 DIAGNOSIS — Z1231 Encounter for screening mammogram for malignant neoplasm of breast: Secondary | ICD-10-CM | POA: Diagnosis present

## 2016-03-03 ENCOUNTER — Telehealth: Payer: 59 | Admitting: Family

## 2016-03-03 DIAGNOSIS — J111 Influenza due to unidentified influenza virus with other respiratory manifestations: Secondary | ICD-10-CM

## 2016-03-03 MED ORDER — OSELTAMIVIR PHOSPHATE 75 MG PO CAPS: 75.0000 mg | ORAL_CAPSULE | Freq: Two times a day (BID) | ORAL | 0 refills | Status: DC

## 2016-03-03 NOTE — Progress Notes (Signed)

## 2017-02-19 ENCOUNTER — Other Ambulatory Visit (HOSPITAL_COMMUNITY): Payer: Self-pay | Admitting: Family Medicine

## 2017-02-19 DIAGNOSIS — Z1231 Encounter for screening mammogram for malignant neoplasm of breast: Secondary | ICD-10-CM

## 2017-02-24 ENCOUNTER — Ambulatory Visit (HOSPITAL_COMMUNITY)
Admission: RE | Admit: 2017-02-24 | Discharge: 2017-02-24 | Disposition: A | Discharge status: Home / Self Care | Payer: 59 | Source: Ambulatory Visit | Attending: Family Medicine | Admitting: Family Medicine

## 2017-02-24 DIAGNOSIS — Z1231 Encounter for screening mammogram for malignant neoplasm of breast: Secondary | ICD-10-CM

## 2017-05-25 ENCOUNTER — Ambulatory Visit (HOSPITAL_COMMUNITY)
Admission: EM | Admit: 2017-05-25 | Discharge: 2017-05-25 | Disposition: A | Discharge status: Home / Self Care | Payer: 59

## 2017-05-25 ENCOUNTER — Encounter (HOSPITAL_COMMUNITY): Payer: Self-pay | Admitting: Emergency Medicine

## 2017-05-25 DIAGNOSIS — H9213 Otorrhea, bilateral: Secondary | ICD-10-CM

## 2017-05-25 MED ORDER — AZITHROMYCIN 250 MG PO TABS: ORAL_TABLET | ORAL | 0 refills | Status: AC

## 2017-05-25 MED ORDER — CETIRIZINE-PSEUDOEPHEDRINE ER 5-120 MG PO TB12: 1.0000 | ORAL_TABLET | Freq: Two times a day (BID) | ORAL | 0 refills | Status: AC

## 2017-05-25 NOTE — ED Triage Notes (Signed)
Pt states "Thursday I started like flu like symptoms, I did the virtual provider and they gave me tamiflu, yesterday my ears started hurting bad, both ears were throbbing last night and they drained fluid all night long"

## 2017-05-25 NOTE — ED Provider Notes (Signed)
05/25/2017 2:15 PM   DOB: 04/08/1965 / MRN: [id number]  SUBJECTIVE:  [name] is a 52 y.o. female presenting for bilateral ear pain and pressure that each she describes as pulsatile in nature.  Associates bilateral discharge.  States she can breathe through her nose but last night she could not.  Denies fever and chills.  Was recently treated for the flu given her daughter was recently treated for the flu.  She feels that she is getting worse.  Denies fever.  Denies associated change in hearing  She is allergic to codeine; other; sulfa antibiotics; and ancef [cefazolin].   She  has a past medical history of Anxiety and Medical history non-contributory.    She  reports that she has never smoked. She has never used smokeless tobacco. She reports that she drinks alcohol. She reports that she does not use drugs. She  reports that she currently engages in sexual activity. She reports using the following method of birth control/protection: Surgical. The patient  has a past surgical history that includes Cesarean section; Abdominal hysterectomy; Appendectomy; and Laparoscopic gastric banding.  Her family history includes Cancer in her maternal grandfather, maternal grandmother, and paternal grandmother; Diabetes in her paternal grandfather; Heart disease in her paternal grandfather; Transient ischemic attack in her maternal grandmother.  ROS per HPI  OBJECTIVE:  BP 134/76 (BP Location: Left Arm)   Pulse 84   Temp 98.3 F (36.8 C) (Oral)   Resp 18   SpO2 97%   Physical Exam  Constitutional: She is oriented to person, place, and time. She appears well-developed and well-nourished. No distress.  HENT:  Right Ear: External ear normal.  Left Ear: External ear normal.  Nose: Mucosal edema present. Right sinus exhibits no maxillary sinus tenderness and no frontal sinus tenderness. Left sinus exhibits no maxillary sinus tenderness and no frontal sinus tenderness.  Mouth/Throat: Oropharynx is  clear and moist. No oropharyngeal exudate.  Bilateral tympanic membranes appear retracted and mildly erythematous.  No perforation readily identifiable.  Canal appears normal and is nontender.  Negative for mastoid tenderness bilaterally.  Eyes: Pupils are equal, round, and reactive to light. Conjunctivae and EOM are normal.  Cardiovascular: Normal rate, regular rhythm and normal heart sounds.  Pulmonary/Chest: Effort normal and breath sounds normal. She has no rales.  Abdominal: She exhibits no distension.  Musculoskeletal: Normal range of motion.  Neurological: She is alert and oriented to person, place, and time. No cranial nerve deficit. Gait normal.  Skin: Skin is warm and dry. No rash noted. She is not diaphoretic. No erythema.  Psychiatric: She has a normal mood and affect. Her behavior is normal.  Vitals reviewed.   No results found for this or any previous visit (from the past 72 hour(s)).  No results found.  ASSESSMENT AND PLAN:  No orders of the defined types were placed in this encounter.    Ear drainage, bilateral 52 year old female with a history of well-controlled diabetes here today with complaint of bilateral ear pain and drainage.  I think her symptoms most likely represent eustachian tube dysfunction however should not cause significant amount of drainage.  Starting her on azithromycin along with pseudoephedrine with Zyrtec.  Of asked that she establish an appointment with her primary care in about 4 days for recheck.      The patient is advised to call or return to clinic if she does not see an improvement in symptoms, or to seek the care of the closest emergency department if she worsens  with the above plan.   Deliah Boston, MHS, PA-C 05/25/2017 2:15 PM    Ofilia Neas, PA-C 05/25/17 1416

## 2017-05-25 NOTE — Discharge Instructions (Signed)
Please take both meds as prescribed.  Please make an appointment with your PCP for a recheck in about 4-5 days.

## 2018-10-27 ENCOUNTER — Other Ambulatory Visit (HOSPITAL_COMMUNITY): Payer: Self-pay | Admitting: Family Medicine

## 2018-10-27 ENCOUNTER — Other Ambulatory Visit: Payer: Self-pay | Admitting: Family Medicine

## 2018-10-27 DIAGNOSIS — R1032 Left lower quadrant pain: Secondary | ICD-10-CM

## 2018-10-28 ENCOUNTER — Ambulatory Visit (HOSPITAL_COMMUNITY)
Admission: RE | Admit: 2018-10-28 | Discharge: 2018-10-28 | Disposition: A | Discharge status: Home / Self Care | Payer: No Typology Code available for payment source | Source: Ambulatory Visit | Attending: Family Medicine | Admitting: Family Medicine

## 2018-10-28 ENCOUNTER — Other Ambulatory Visit: Payer: Self-pay

## 2018-10-28 DIAGNOSIS — R1032 Left lower quadrant pain: Secondary | ICD-10-CM | POA: Insufficient documentation

## 2018-10-28 LAB — POCT I-STAT CREATININE: Creatinine, Ser: 0.6 mg/dL (ref 0.44–1.00)

## 2018-10-28 MED ORDER — IOHEXOL 300 MG/ML  SOLN
30.0000 mL | Freq: Once | INTRAMUSCULAR | Status: AC | PRN
  Administered 2018-10-28: 30 mL via ORAL

## 2018-10-28 MED ORDER — IOHEXOL 300 MG/ML  SOLN
100.0000 mL | Freq: Once | INTRAMUSCULAR | Status: AC | PRN
  Administered 2018-10-28: 11:00:00 100 mL via INTRAVENOUS

## 2020-01-19 ENCOUNTER — Encounter (HOSPITAL_COMMUNITY): Payer: Self-pay | Admitting: Internal Medicine

## 2020-01-19 ENCOUNTER — Inpatient Hospital Stay (HOSPITAL_COMMUNITY)
Admission: EM | Admit: 2020-01-19 | Discharge: 2020-01-24 | DRG: 177 | Disposition: A | Discharge status: Home / Self Care | Payer: No Typology Code available for payment source | Attending: Internal Medicine | Admitting: Internal Medicine

## 2020-01-19 ENCOUNTER — Emergency Department (HOSPITAL_COMMUNITY): Payer: No Typology Code available for payment source

## 2020-01-19 DIAGNOSIS — Z7984 Long term (current) use of oral hypoglycemic drugs: Secondary | ICD-10-CM

## 2020-01-19 DIAGNOSIS — J9601 Acute respiratory failure with hypoxia: Secondary | ICD-10-CM | POA: Diagnosis not present

## 2020-01-19 DIAGNOSIS — E669 Obesity, unspecified: Secondary | ICD-10-CM | POA: Diagnosis not present

## 2020-01-19 DIAGNOSIS — E039 Hypothyroidism, unspecified: Secondary | ICD-10-CM

## 2020-01-19 DIAGNOSIS — U071 COVID-19: Secondary | ICD-10-CM | POA: Diagnosis not present

## 2020-01-19 DIAGNOSIS — Z90710 Acquired absence of both cervix and uterus: Secondary | ICD-10-CM

## 2020-01-19 DIAGNOSIS — Z9049 Acquired absence of other specified parts of digestive tract: Secondary | ICD-10-CM | POA: Diagnosis not present

## 2020-01-19 DIAGNOSIS — Z6841 Body Mass Index (BMI) 40.0 and over, adult: Secondary | ICD-10-CM | POA: Diagnosis not present

## 2020-01-19 DIAGNOSIS — Z882 Allergy status to sulfonamides status: Secondary | ICD-10-CM | POA: Diagnosis not present

## 2020-01-19 DIAGNOSIS — Z79890 Hormone replacement therapy (postmenopausal): Secondary | ICD-10-CM | POA: Diagnosis not present

## 2020-01-19 DIAGNOSIS — E1165 Type 2 diabetes mellitus with hyperglycemia: Secondary | ICD-10-CM | POA: Diagnosis not present

## 2020-01-19 DIAGNOSIS — E1169 Type 2 diabetes mellitus with other specified complication: Secondary | ICD-10-CM | POA: Diagnosis not present

## 2020-01-19 DIAGNOSIS — Z79899 Other long term (current) drug therapy: Secondary | ICD-10-CM

## 2020-01-19 DIAGNOSIS — I10 Essential (primary) hypertension: Secondary | ICD-10-CM | POA: Diagnosis not present

## 2020-01-19 DIAGNOSIS — Z888 Allergy status to other drugs, medicaments and biological substances status: Secondary | ICD-10-CM | POA: Diagnosis not present

## 2020-01-19 DIAGNOSIS — T380X5A Adverse effect of glucocorticoids and synthetic analogues, initial encounter: Secondary | ICD-10-CM | POA: Diagnosis not present

## 2020-01-19 DIAGNOSIS — F419 Anxiety disorder, unspecified: Secondary | ICD-10-CM | POA: Diagnosis present

## 2020-01-19 DIAGNOSIS — R0602 Shortness of breath: Secondary | ICD-10-CM

## 2020-01-19 DIAGNOSIS — Z885 Allergy status to narcotic agent status: Secondary | ICD-10-CM | POA: Diagnosis not present

## 2020-01-19 HISTORY — DX: Essential (primary) hypertension: I10

## 2020-01-19 HISTORY — DX: Obesity, unspecified: E66.9

## 2020-01-19 HISTORY — DX: Hypothyroidism, unspecified: E03.9

## 2020-01-19 HISTORY — DX: Type 2 diabetes mellitus with other specified complication: E11.69

## 2020-01-19 LAB — CBC
HCT: 42.6 % (ref 36.0–46.0)
Hemoglobin: 14.2 g/dL (ref 12.0–15.0)
MCH: 28.7 pg (ref 26.0–34.0)
MCHC: 33.3 g/dL (ref 30.0–36.0)
MCV: 86.2 fL (ref 80.0–100.0)
Platelets: 178 10*3/uL (ref 150–400)
RBC: 4.94 MIL/uL (ref 3.87–5.11)
RDW: 13.1 % (ref 11.5–15.5)
WBC: 4.3 10*3/uL (ref 4.0–10.5)
nRBC: 0 % (ref 0.0–0.2)

## 2020-01-19 LAB — FIBRINOGEN: Fibrinogen: 496 mg/dL — ABNORMAL HIGH (ref 210–475)

## 2020-01-19 LAB — BASIC METABOLIC PANEL
Anion gap: 14 (ref 5–15)
BUN: 9 mg/dL (ref 6–20)
CO2: 21 mmol/L — ABNORMAL LOW (ref 22–32)
Calcium: 8.1 mg/dL — ABNORMAL LOW (ref 8.9–10.3)
Chloride: 95 mmol/L — ABNORMAL LOW (ref 98–111)
Creatinine, Ser: 0.79 mg/dL (ref 0.44–1.00)
GFR, Estimated: >60 mL/min (ref >60)
Glucose, Bld: 383 mg/dL — ABNORMAL HIGH (ref 70–99)
Potassium: 3.6 mmol/L (ref 3.5–5.1)
Sodium: 130 mmol/L — ABNORMAL LOW (ref 135–145)

## 2020-01-19 LAB — C-REACTIVE PROTEIN: CRP: 12.1 mg/dL — ABNORMAL HIGH (ref <1.0)

## 2020-01-19 LAB — FERRITIN: Ferritin: 390 ng/mL — ABNORMAL HIGH (ref 11–307)

## 2020-01-19 LAB — CBG MONITORING, ED: Glucose-Capillary: 372 mg/dL — ABNORMAL HIGH (ref 70–99)

## 2020-01-19 LAB — LACTATE DEHYDROGENASE: LDH: 397 U/L — ABNORMAL HIGH (ref 98–192)

## 2020-01-19 LAB — LACTIC ACID, PLASMA: Lactic Acid, Venous: 1.6 mmol/L (ref 0.5–1.9)

## 2020-01-19 LAB — HEPATIC FUNCTION PANEL
ALT: 37 U/L (ref 0–44)
AST: 48 U/L — ABNORMAL HIGH (ref 15–41)
Albumin: 3.2 g/dL — ABNORMAL LOW (ref 3.5–5.0)
Alkaline Phosphatase: 79 U/L (ref 38–126)
Bilirubin, Direct: 0.3 mg/dL — ABNORMAL HIGH (ref 0.0–0.2)
Indirect Bilirubin: 1 mg/dL — ABNORMAL HIGH (ref 0.3–0.9)
Total Bilirubin: 1.3 mg/dL — ABNORMAL HIGH (ref 0.3–1.2)
Total Protein: 6.2 g/dL — ABNORMAL LOW (ref 6.5–8.1)

## 2020-01-19 LAB — HIV ANTIBODY (ROUTINE TESTING W REFLEX): HIV Screen 4th Generation wRfx: NONREACTIVE

## 2020-01-19 LAB — HEMOGLOBIN A1C
Hgb A1c MFr Bld: 9.3 % — ABNORMAL HIGH (ref 4.8–5.6)
Mean Plasma Glucose: 220.21 mg/dL

## 2020-01-19 LAB — PROCALCITONIN: Procalcitonin: <0.1 ng/mL

## 2020-01-19 LAB — D-DIMER, QUANTITATIVE: D-Dimer, Quant: 1.24 ug/mL-FEU — ABNORMAL HIGH (ref 0.00–0.50)

## 2020-01-19 LAB — POC SARS CORONAVIRUS 2 AG -  ED: SARS Coronavirus 2 Ag: POSITIVE — AB

## 2020-01-19 MED ORDER — SODIUM CHLORIDE 0.9 % IV SOLN
200.0000 mg | Freq: Once | INTRAVENOUS | Status: AC
  Administered 2020-01-19: 200 mg via INTRAVENOUS
  Filled 2020-01-19: qty 40

## 2020-01-19 MED ORDER — SODIUM CHLORIDE 0.9% FLUSH
3.0000 mL | Freq: Two times a day (BID) | INTRAVENOUS | Status: DC
  Administered 2020-01-19 – 2020-01-23 (×6): 3 mL via INTRAVENOUS

## 2020-01-19 MED ORDER — ASCORBIC ACID 500 MG PO TABS
500.0000 mg | ORAL_TABLET | Freq: Every day | ORAL | Status: DC
  Administered 2020-01-19 – 2020-01-24 (×6): 500 mg via ORAL
  Filled 2020-01-19 (×6): qty 1

## 2020-01-19 MED ORDER — ZINC SULFATE 220 (50 ZN) MG PO CAPS
220.0000 mg | ORAL_CAPSULE | Freq: Every day | ORAL | Status: DC
  Administered 2020-01-19 – 2020-01-24 (×6): 220 mg via ORAL
  Filled 2020-01-19 (×6): qty 1

## 2020-01-19 MED ORDER — PREDNISONE 20 MG PO TABS: 50.0000 mg | ORAL_TABLET | Freq: Every day | ORAL | Status: DC

## 2020-01-19 MED ORDER — ALPRAZOLAM 0.5 MG PO TABS
0.5000 mg | ORAL_TABLET | Freq: Every day | ORAL | Status: DC | PRN
  Administered 2020-01-20: 0.5 mg via ORAL
  Filled 2020-01-19: qty 1

## 2020-01-19 MED ORDER — ACETAMINOPHEN 325 MG PO TABS: 650.0000 mg | ORAL_TABLET | Freq: Four times a day (QID) | ORAL | Status: DC | PRN

## 2020-01-19 MED ORDER — ENOXAPARIN SODIUM 40 MG/0.4ML ~~LOC~~ SOLN
40.0000 mg | SUBCUTANEOUS | Status: DC
  Administered 2020-01-19: 40 mg via SUBCUTANEOUS
  Filled 2020-01-19: qty 0.4

## 2020-01-19 MED ORDER — METHYLPREDNISOLONE SODIUM SUCC 125 MG IJ SOLR
0.5000 mg/kg | Freq: Two times a day (BID) | INTRAMUSCULAR | Status: DC
  Administered 2020-01-19 – 2020-01-20 (×2): 57.5 mg via INTRAVENOUS
  Filled 2020-01-19 (×2): qty 2

## 2020-01-19 MED ORDER — HYDROCHLOROTHIAZIDE 25 MG PO TABS
25.0000 mg | ORAL_TABLET | Freq: Every day | ORAL | Status: DC
Start: 2020-01-20 — End: 2020-01-20

## 2020-01-19 MED ORDER — ONDANSETRON HCL 4 MG PO TABS: 4.0000 mg | ORAL_TABLET | Freq: Four times a day (QID) | ORAL | Status: DC | PRN

## 2020-01-19 MED ORDER — ONDANSETRON HCL 4 MG/2ML IJ SOLN: 4.0000 mg | Freq: Four times a day (QID) | INTRAMUSCULAR | Status: DC | PRN

## 2020-01-19 MED ORDER — INSULIN ASPART 100 UNIT/ML ~~LOC~~ SOLN
6.0000 [IU] | Freq: Three times a day (TID) | SUBCUTANEOUS | Status: DC
  Administered 2020-01-20: 6 [IU] via SUBCUTANEOUS

## 2020-01-19 MED ORDER — INSULIN ASPART 100 UNIT/ML ~~LOC~~ SOLN
0.0000 [IU] | Freq: Three times a day (TID) | SUBCUTANEOUS | Status: DC
  Administered 2020-01-20: 15 [IU] via SUBCUTANEOUS

## 2020-01-19 MED ORDER — ALBUTEROL SULFATE HFA 108 (90 BASE) MCG/ACT IN AERS
2.0000 | INHALATION_SPRAY | RESPIRATORY_TRACT | Status: DC | PRN
  Filled 2020-01-19: qty 6.7

## 2020-01-19 MED ORDER — DEXAMETHASONE 4 MG PO TABS
6.0000 mg | ORAL_TABLET | ORAL | Status: DC
  Filled 2020-01-19: qty 2

## 2020-01-19 MED ORDER — LEVOTHYROXINE SODIUM 25 MCG PO TABS
25.0000 ug | ORAL_TABLET | Freq: Every day | ORAL | Status: DC
  Administered 2020-01-20 – 2020-01-24 (×5): 25 ug via ORAL
  Filled 2020-01-19 (×5): qty 1

## 2020-01-19 MED ORDER — POLYETHYLENE GLYCOL 3350 17 G PO PACK: 17.0000 g | PACK | Freq: Every day | ORAL | Status: DC | PRN

## 2020-01-19 MED ORDER — SODIUM CHLORIDE 0.9% FLUSH: 3.0000 mL | INTRAVENOUS | Status: DC | PRN

## 2020-01-19 MED ORDER — SODIUM CHLORIDE 0.9 % IV SOLN
100.0000 mg | Freq: Every day | INTRAVENOUS | Status: AC
  Administered 2020-01-20 – 2020-01-23 (×4): 100 mg via INTRAVENOUS
  Filled 2020-01-19 (×5): qty 20

## 2020-01-19 MED ORDER — FLEET ENEMA 7-19 GM/118ML RE ENEM: 1.0000 | ENEMA | Freq: Once | RECTAL | Status: DC | PRN

## 2020-01-19 MED ORDER — LOSARTAN POTASSIUM 50 MG PO TABS
100.0000 mg | ORAL_TABLET | Freq: Every day | ORAL | Status: DC
  Administered 2020-01-20 – 2020-01-24 (×5): 100 mg via ORAL
  Filled 2020-01-19 (×5): qty 2

## 2020-01-19 MED ORDER — GUAIFENESIN-DM 100-10 MG/5ML PO SYRP
10.0000 mL | ORAL_SOLUTION | ORAL | Status: DC | PRN
  Administered 2020-01-21 (×2): 10 mL via ORAL
  Filled 2020-01-19 (×2): qty 10

## 2020-01-19 MED ORDER — SODIUM CHLORIDE 0.9% FLUSH
3.0000 mL | Freq: Two times a day (BID) | INTRAVENOUS | Status: DC
  Administered 2020-01-19 – 2020-01-23 (×8): 3 mL via INTRAVENOUS

## 2020-01-19 MED ORDER — SODIUM CHLORIDE 0.9 % IV SOLN: 250.0000 mL | INTRAVENOUS | Status: DC | PRN

## 2020-01-19 MED ORDER — OXYCODONE HCL 5 MG PO TABS: 5.0000 mg | ORAL_TABLET | ORAL | Status: DC | PRN

## 2020-01-19 MED ORDER — BISACODYL 5 MG PO TBEC
5.0000 mg | DELAYED_RELEASE_TABLET | Freq: Every day | ORAL | Status: DC | PRN
Start: 2020-01-19 — End: 2020-01-24

## 2020-01-19 MED ORDER — INSULIN ASPART 100 UNIT/ML ~~LOC~~ SOLN
0.0000 [IU] | Freq: Every day | SUBCUTANEOUS | Status: DC
  Administered 2020-01-19 – 2020-01-20 (×2): 5 [IU] via SUBCUTANEOUS
  Administered 2020-01-21: 3 [IU] via SUBCUTANEOUS
  Administered 2020-01-22: 4 [IU] via SUBCUTANEOUS
  Administered 2020-01-23: 3 [IU] via SUBCUTANEOUS

## 2020-01-19 NOTE — ED Triage Notes (Signed)
Pt reports covid sx beginning 12/18, began earache and stuffy nose. Now reports has increased SOB, low oxygen sats. Denies recent fever. No distress at this time.

## 2020-01-19 NOTE — H&P (Signed)
History and Physical    Marie NakayamaQuinta S Hardigree UXL:[id number] DOB: 10-30-1965 DOA: 01/19/2020  PCP: Assunta FoundGolding, John, MD Consultants:  None Patient coming from:  Home - lives with husband; NOK: Koleen NimrodHusband, Ryan Colligan, [phone]  Chief Complaint: Worsening COVID symptoms  HPI: [name] is a 54 y.o. female with medical history significant of morbid obesity (BMI 42.27) and DM presenting with worsening COVID symptoms.  She reports that she began feeling bad on 12/18.  She had congestion, fever, cough, SOB, and fatigue.  Mild diarrhea.  She took an at-home test at one point and it was positive.  She decided to come in because symptoms were still ongoing and worsening.  She is unvaccinated.    ED Course:  Unvaccinated diabetic COVID patient. Took home test.  Sats into 80s with ambulation. CXR concerning.  Given Decadron and Remdesivir.  Review of Systems: As per HPI; otherwise review of systems reviewed and negative.   Ambulatory Status:  Ambulates without assistance  COVID Vaccine Status:  None  Past Medical History:  Diagnosis Date  . Anxiety   . Diabetes mellitus type 2 in obese (HCC)   . Essential hypertension   . Hypothyroidism (acquired) 01/19/2020    Past Surgical History:  Procedure Laterality Date  . ABDOMINAL HYSTERECTOMY    . APPENDECTOMY    . CESAREAN SECTION    . LAPAROSCOPIC GASTRIC BANDING      Social History   Socioeconomic History  . Marital status: Married    Spouse name: Not on file  . Number of children: Not on file  . Years of education: Not on file  . Highest education level: Not on file  Occupational History  . Not on file  Tobacco Use  . Smoking status: Never Smoker  . Smokeless tobacco: Never Used  Substance and Sexual Activity  . Alcohol use: Yes    Comment: occasional  . Drug use: No  . Sexual activity: Yes    Birth control/protection: Surgical  Other Topics Concern  . Not on file  Social History Narrative  . Not on file    Social Determinants of Health   Financial Resource Strain: Not on file  Food Insecurity: Not on file  Transportation Needs: Not on file  Physical Activity: Not on file  Stress: Not on file  Social Connections: Not on file  Intimate Partner Violence: Not on file    Allergies  Allergen Reactions  . Codeine Itching and Swelling  . Other     Steroids  Causes patients hair to fall out  . Sulfa Antibiotics Swelling  . Ancef [Cefazolin] Swelling and Rash    Family History  Problem Relation Age of Onset  . Cancer Maternal Grandmother        breast  . Transient ischemic attack Maternal Grandmother   . Cancer Maternal Grandfather        lung  . Cancer Paternal Grandmother        breast  . Diabetes Paternal Grandfather   . Heart disease Paternal Grandfather     Prior to Admission medications   Medication Sig Start Date End Date Taking? Authorizing Provider  albuterol (PROVENTIL) (2.5 MG/3ML) 0.083% nebulizer solution Take 3 mLs (2.5 mg total) by nebulization every 6 (six) hours as needed for wheezing. 10/03/12   Graylon GoodBaker, Zachary H, PA-C  albuterol (VENTOLIN HFA) 108 (90 Base) MCG/ACT inhaler SMARTSIG:2-4 Puff(s) By Mouth Every 3-4 Hours PRN 01/13/20   [provider]  ALPRAZolam Prudy Feeler(XANAX) 0.5 MG tablet Take 0.5 mg  by mouth daily as needed for sleep or anxiety.    [provider]  benzonatate (TESSALON) 100 MG capsule SMARTSIG:1-2 Capsule(s) By Mouth 1 to 3 Times Daily 01/13/20   [provider]  buPROPion HCl (WELLBUTRIN PO) Take by mouth.    [provider]  fluconazole (DIFLUCAN) 150 MG tablet Take 150 mg by mouth once. 01/16/20   [provider]  ibuprofen (ADVIL,MOTRIN) 200 MG tablet Take 800 mg by mouth daily as needed for pain or headache.    [provider]  levothyroxine (SYNTHROID, LEVOTHROID) 25 MCG tablet Take 25 mcg by mouth daily before breakfast.    [provider]  losartan-hydrochlorothiazide (HYZAAR) 100-25  MG tablet TAKE ONE TABLET BY MOUTH ONCE DAILY    [provider]  metFORMIN (GLUCOPHAGE) 500 MG tablet Take 1,000 mg by mouth every morning.     [provider]  mupirocin ointment (BACTROBAN) 2 % SMARTSIG:1 Application Topical 2-3 Times Daily 01/17/20   [provider]  Zolpidem Tartrate (AMBIEN PO) Take by mouth.    [provider]    Physical Exam: Vitals:   01/19/20 1203 01/19/20 1419 01/19/20 1711 01/19/20 1715  BP: 122/64 127/64 121/79 (!) 152/71  Pulse: 81 81 83 95  Resp: 18 17 17  (!) 24  Temp:  98.6 F (37 C)    TempSrc:  Oral    SpO2: 91% 96% 96% 96%  Weight:      Height:         . General:  Appears ill but is in NAD . Eyes:  PERRL, EOMI, normal lids, iris . ENT:  grossly normal hearing, lips & tongue, mildly dry mm; appropriate dentition . Neck:  no LAD, masses or thyromegaly . Cardiovascular:  RRR, no m/r/g. No LE edema.  Respiratory:   CTA bilaterally with no wheezes/rales/rhonchi.  Normal respiratory effort. . Abdomen:  soft, NT, ND, NABS . Back:   normal alignment, no CVAT . Skin:  no rash or induration seen on limited exam . Musculoskeletal:  grossly normal tone BUE/BLE, good ROM, no bony abnormality . Psychiatric:  Blunted mood and affect, speech fluent and appropriate, AOx3 . Neurologic:  CN 2-12 grossly intact, moves all extremities in coordinated fashion    Radiological Exams on Admission: Independently reviewed - see discussion in A/P where applicable  DG Chest 2 View  Result Date: 01/19/2020 CLINICAL DATA:  Fever, shortness of breath.  COVID positive. EXAM: CHEST - 2 VIEW COMPARISON:  September 14, 2015. FINDINGS: Patchy bilateral airspace opacities. No visible pleural effusions or pneumothorax. Cardiomediastinal silhouette is within normal limits and similar to prior. Gastric band is in similar position. IMPRESSION: Patchy bilateral airspace opacities, compatible with multifocal pneumonia and reported COVID diagnosis.  Electronically Signed   By: September 16, 2015 MD   On: 01/19/2020 12:58    EKG: Independently reviewed.  NSR with rate 94; nonspecific ST changes with no evidence of acute ischemia   Labs on Admission: I have personally reviewed the available labs and imaging studies at the time of the admission.  Pertinent labs:   Glucose 383 Normal CBC COVID POSITIVE   Assessment/Plan Principal Problem:   Acute hypoxemic respiratory failure due to COVID-19 [facility]) Active Problems:   Diabetes mellitus type 2 in obese (HCC)   Anxiety   Essential hypertension   Hypothyroidism (acquired)   Acute respiratory failure with hypoxia due to COVID-19 PNA -Patient with presenting with SOB and fever and cough -Mild diarrhea noted without the presence of other  GI symptoms -She does not have a usual home O2 requirement and is currently requiring 2L [place] O2 -COVID POSITIVE -The patient has comorbidities which may increase the risk for ARDS/MODS including: age, HTN, DM, obesity -Pertinent labs concerning for COVID include normal WBC count; other covid labs are pending -CXR with multifocal opacities which may be c/w COVID vs. Multifocal PNA  -Will not treat with broad-spectrum antibiotics if procalcitonin <0.5 -Will admit for further evaluation, close monitoring, and treatment -Monitor on telemetry x at least 24 hours -At this time, will attempt to avoid use of aerosolized medications and use HFAs instead -Will check daily labs including BMP with Mag, Phos; LFTs; CBC with differential; CRP; ferritin; fibrinogen; D-dimer -Will order steroids and Remdesivir (pharmacy consult) given +COVID test, +CXR, and hypoxia <94% on room air -If the patient shows clinical deterioration, consider transfer to ICU with PCCM consultation -Consider IL-6 agonist (Actemra) and/or JAK inhibitor (baricitinib) if the patient does not stabilize on current treatment or if the patient has marked clinical decompensation; the patient does not  appear to require this treatment at this time but has been consented and agrees to receive treatment if there is evidence of clinical worsening despite current treatments. -Will attempt to maintain euvolemia to a net negative fluid status -Will ask the patient to maintain an awake prone position as much as possible -Patient was seen wearing full PPE including: gown, gloves, head cover, N95, and face shield; donning and doffing was in compliance with current standards.  Uncontrolled DM -Will check A1c -hold Glucophage -Cover with moderate-scale SSI including AC and qhs coverage  HTN -Continue Hyzaar  Anxiety -Continue Xanax, Wellbutrin  Hypothyroidism -Check TSH -Continue Synthroid at current dose for now  Morbid obesity -Body mass index is 42.27 kg/m..  -Weight loss should be encouraged -Outpatient PCP/bariatric medicine/bariatric surgery f/u encouraged    DVT prophylaxis:  Lovenox  Code Status:  Full - confirmed with patient Family Communication: None present; I spoke with the patient's husband by telephone. Disposition Plan:  The patient is from: home  Anticipated d/c is to: home without [facility] services once her respiratory issues have been resolved.  She may require home O2 at the time of discharge.  Anticipated d/c date will depend on clinical response to treatment, likely between 3 days (with completion of outpatient Remdesivir treatment) and 5 days  Patient is currently: acutely ill Consults called: None  Admission status: Admit - It is my clinical opinion that admission to INPATIENT is reasonable and necessary because of the expectation that this patient will require hospital care that crosses at least 2 midnights to treat this condition based on the medical complexity of the problems presented.  Given the aforementioned information, the predictability of an adverse outcome is felt to be significant.    Jonah Blue MD [facility]   How to contact the [facility]  Attending or Consulting provider 7A - 7P or covering provider during after hours 7P -7A, for this patient?  1. Check the care team in [facility] and look for a) attending/consulting [facility] provider listed and b) the [facility] team listed 2. Log into [email or web address] and use [place]'s universal password to access. If you do not have the password, please contact the hospital operator. 3. Locate the [facility] provider you are looking for under [facility] and page to a number that you can be directly reached. 4. If you still have difficulty reaching the provider, please page the [facility] (Director on Call) for the Hospitalists listed on amion  for assistance.   01/19/2020, 8:45 PM

## 2020-01-19 NOTE — ED Provider Notes (Signed)
[facility] EMERGENCY DEPARTMENT Provider Note   CSN: [id number] Arrival date & time: 01/19/20  1114     History Chief Complaint  Patient presents with  . Shortness of Breath    [name] is a 54 y.o. female.  The history is provided by the patient.  Shortness of Breath Severity:  Moderate Onset quality:  Gradual Timing:  Constant Progression:  Worsening Chronicity:  New Context: URI (COVID positive a few days ago, symptoms about 10 days ago or so)   Relieved by:  Nothing Worsened by:  Nothing Associated symptoms: cough and fever   Associated symptoms: no abdominal pain, no chest pain, no ear pain, no rash, no sore throat and no vomiting        Past Medical History:  Diagnosis Date  . Anxiety   . Medical history non-contributory     There are no problems to display for this patient.   Past Surgical History:  Procedure Laterality Date  . ABDOMINAL HYSTERECTOMY    . APPENDECTOMY    . CESAREAN SECTION    . LAPAROSCOPIC GASTRIC BANDING       OB History    Gravida  4   Para  4   Term  2   Preterm  2   AB      Living  4     SAB      IAB      Ectopic      Multiple      Live Births  3           Family History  Problem Relation Age of Onset  . Cancer Maternal Grandmother        breast  . Transient ischemic attack Maternal Grandmother   . Cancer Maternal Grandfather        lung  . Cancer Paternal Grandmother        breast  . Diabetes Paternal Grandfather   . Heart disease Paternal Grandfather     Social History   Tobacco Use  . Smoking status: Never Smoker  . Smokeless tobacco: Never Used  Substance Use Topics  . Alcohol use: Yes    Comment: occasional  . Drug use: No    Home Medications Prior to Admission medications   Medication Sig Start Date End Date Taking? Authorizing Provider  albuterol (PROVENTIL) (2.5 MG/3ML) 0.083% nebulizer solution Take 3 mLs (2.5 mg total) by nebulization every 6  (six) hours as needed for wheezing. 10/03/12   Graylon Good, PA-C  albuterol (VENTOLIN HFA) 108 (90 Base) MCG/ACT inhaler SMARTSIG:2-4 Puff(s) By Mouth Every 3-4 Hours PRN 01/13/20   [provider]  ALPRAZolam Prudy Feeler) 0.5 MG tablet Take 0.5 mg by mouth daily as needed for sleep or anxiety.    [provider]  benzonatate (TESSALON) 100 MG capsule SMARTSIG:1-2 Capsule(s) By Mouth 1 to 3 Times Daily 01/13/20   [provider]  buPROPion HCl (WELLBUTRIN PO) Take by mouth.    [provider]  fluconazole (DIFLUCAN) 150 MG tablet Take 150 mg by mouth once. 01/16/20   [provider]  ibuprofen (ADVIL,MOTRIN) 200 MG tablet Take 800 mg by mouth daily as needed for pain or headache.    [provider]  levothyroxine (SYNTHROID, LEVOTHROID) 25 MCG tablet Take 25 mcg by mouth daily before breakfast.    [provider]  losartan-hydrochlorothiazide (HYZAAR) 100-25 MG tablet TAKE ONE TABLET BY MOUTH ONCE DAILY    [provider]  metFORMIN (GLUCOPHAGE)  500 MG tablet Take 1,000 mg by mouth every morning.     [provider]  mupirocin ointment (BACTROBAN) 2 % SMARTSIG:1 Application Topical 2-3 Times Daily 01/17/20   [provider]  Zolpidem Tartrate (AMBIEN PO) Take by mouth.    [provider]    Allergies    Codeine, Other, Sulfa antibiotics, and Ancef [cefazolin]  Review of Systems   Review of Systems  Constitutional: Positive for fever. Negative for chills.  HENT: Negative for ear pain and sore throat.   Eyes: Negative for pain and visual disturbance.  Respiratory: Positive for cough and shortness of breath.   Cardiovascular: Negative for chest pain and palpitations.  Gastrointestinal: Positive for diarrhea. Negative for abdominal pain and vomiting.  Genitourinary: Negative for dysuria and hematuria.  Musculoskeletal: Negative for arthralgias and back pain.  Skin: Negative for color change and  rash.  Neurological: Positive for weakness. Negative for seizures and syncope.  All other systems reviewed and are negative.   Physical Exam Updated Vital Signs  ED Triage Vitals  Enc Vitals Group     BP 01/19/20 1125 134/70     Pulse Rate 01/19/20 1125 95     Resp 01/19/20 1125 20     Temp 01/19/20 1125 99.6 F (37.6 C)     Temp Source 01/19/20 1125 Oral     SpO2 01/19/20 1125 92 %     Weight 01/19/20 1123 254 lb (115.2 kg)     Height 01/19/20 1123 5\' 5"  (1.651 m)     Head Circumference --      Peak Flow --      Pain Score 01/19/20 1123 0     Pain Loc --      Pain Edu? --      Excl. in GC? --     Physical Exam Vitals and nursing note reviewed.  Constitutional:      General: She is not in acute distress.    Appearance: She is well-developed and well-nourished. She is obese. She is ill-appearing.  HENT:     Head: Normocephalic and atraumatic.     Mouth/Throat:     Mouth: Mucous membranes are moist.  Eyes:     Conjunctiva/sclera: Conjunctivae normal.     Pupils: Pupils are equal, round, and reactive to light.  Cardiovascular:     Rate and Rhythm: Normal rate and regular rhythm.     Pulses: Normal pulses.     Heart sounds: Normal heart sounds. No murmur heard.   Pulmonary:     Effort: Tachypnea present. No respiratory distress.     Breath sounds: Decreased breath sounds present.  Abdominal:     Palpations: Abdomen is soft.     Tenderness: There is no abdominal tenderness.  Musculoskeletal:        General: No edema.     Cervical back: Normal range of motion and neck supple.     Right lower leg: No edema.     Left lower leg: No edema.  Skin:    General: Skin is warm and dry.     Capillary Refill: Capillary refill takes less than 2 seconds.  Neurological:     General: No focal deficit present.     Mental Status: She is alert.  Psychiatric:        Mood and Affect: Mood and affect and mood normal.     ED Results / Procedures / Treatments   Labs (all labs  ordered are listed, but only abnormal results are  displayed) Labs Reviewed  BASIC METABOLIC PANEL - Abnormal; Notable for the following components:      Result Value   Sodium 130 (*)    Chloride 95 (*)    CO2 21 (*)    Glucose, Bld 383 (*)    Calcium 8.1 (*)    All other components within normal limits  POC SARS CORONAVIRUS 2 AG -  ED - Abnormal; Notable for the following components:   SARS Coronavirus 2 Ag POSITIVE (*)    All other components within normal limits  CULTURE, BLOOD (ROUTINE X 2)  CULTURE, BLOOD (ROUTINE X 2)  CBC  LACTIC ACID, PLASMA  LACTIC ACID, PLASMA  D-DIMER, QUANTITATIVE (NOT AT [facility])  PROCALCITONIN  LACTATE DEHYDROGENASE  FERRITIN  TRIGLYCERIDES  FIBRINOGEN  C-REACTIVE PROTEIN  HEPATIC FUNCTION PANEL  HIV ANTIBODY (ROUTINE TESTING W REFLEX)    EKG EKG Interpretation  Date/Time:  Wednesday January 19 2020 11:22:44 EST Ventricular Rate:  94 PR Interval:  146 QRS Duration: 78 QT Interval:  356 QTC Calculation: 445 R Axis:   -11 Text Interpretation: Normal sinus rhythm Septal infarct , age undetermined Abnormal ECG Confirmed by Virgina Norfolk [phone]) on 01/19/2020 4:03:05 PM Also confirmed by Virgina Norfolk [phone])  on 01/19/2020 4:53:27 PM   Radiology DG Chest 2 View  Result Date: 01/19/2020 CLINICAL DATA:  Fever, shortness of breath.  COVID positive. EXAM: CHEST - 2 VIEW COMPARISON:  September 14, 2015. FINDINGS: Patchy bilateral airspace opacities. No visible pleural effusions or pneumothorax. Cardiomediastinal silhouette is within normal limits and similar to prior. Gastric band is in similar position. IMPRESSION: Patchy bilateral airspace opacities, compatible with multifocal pneumonia and reported COVID diagnosis. Electronically Signed   By: Feliberto Harts MD   On: 01/19/2020 12:58    Procedures Procedures (including critical care time)  Medications Ordered in ED Medications  dexamethasone (DECADRON) tablet 6 mg (has no administration in  time range)  remdesivir 200 mg in sodium chloride 0.9% 250 mL IVPB (has no administration in time range)    Followed by  remdesivir 100 mg in sodium chloride 0.9 % 100 mL IVPB (has no administration in time range)    ED Course  I have reviewed the triage vital signs and the nursing notes.  Pertinent labs & imaging results that were available during my care of the patient were reviewed by me and considered in my medical decision making (see chart for details).    MDM Rules/Calculators/A&P                          [name] is a 54 year old female who presents to the ED with shortness of breath.  Positive for Covid several days ago.  Symptoms for about a little over a week.  Hypoxic in the 80s at home and hypoxic in the 80s here.  Placed on 2 L with improvement.  Chest x-ray consistent with Covid pneumonia.  Otherwise no significant anemia or electrolyte abnormality except for elevated blood sugar.  She is a known diabetic.  No history of smoking.  Overall she is very symptomatic with cough and shortness of breath with minimal walking in the room which dropped her oxygen to the mid 80s.  Patient is unvaccinated.  Will start steroid and remdesivir treatment and have her admitted to the hospitalist for further Covid care in the setting of hypoxia and Covid pneumonia.  This chart was dictated using voice recognition software.  Despite best efforts to proofread,  errors can occur which can change the documentation meaning.  [name] was evaluated in Emergency Department on 01/19/2020 for the symptoms described in the history of present illness. She was evaluated in the context of the global COVID-19 pandemic, which necessitated consideration that the patient might be at risk for infection with the SARS-CoV-2 virus that causes COVID-19. Institutional protocols and algorithms that pertain to the evaluation of patients at risk for COVID-19 are in a state of rapid change based on  information released by regulatory bodies including the CDC and federal and state organizations. These policies and algorithms were followed during the patient's care in the ED.   Final Clinical Impression(s) / ED Diagnoses Final diagnoses:  Acute respiratory failure with hypoxia [facility])  COVID-19    Rx / DC Orders ED Discharge Orders    None       Virgina Norfolk, DO 01/19/20 1744

## 2020-01-19 NOTE — ED Notes (Signed)
Pt placed on 2L [place] due to sats dropping to 90% and pt feeling short of breath

## 2020-01-20 ENCOUNTER — Other Ambulatory Visit: Payer: Self-pay

## 2020-01-20 DIAGNOSIS — E1169 Type 2 diabetes mellitus with other specified complication: Secondary | ICD-10-CM | POA: Diagnosis not present

## 2020-01-20 DIAGNOSIS — F419 Anxiety disorder, unspecified: Secondary | ICD-10-CM

## 2020-01-20 DIAGNOSIS — I10 Essential (primary) hypertension: Secondary | ICD-10-CM

## 2020-01-20 DIAGNOSIS — E669 Obesity, unspecified: Secondary | ICD-10-CM

## 2020-01-20 DIAGNOSIS — U071 COVID-19: Secondary | ICD-10-CM | POA: Diagnosis not present

## 2020-01-20 DIAGNOSIS — J9601 Acute respiratory failure with hypoxia: Secondary | ICD-10-CM | POA: Diagnosis not present

## 2020-01-20 LAB — CBC WITH DIFFERENTIAL/PLATELET
Abs Immature Granulocytes: 0 10*3/uL (ref 0.00–0.07)
Basophils Absolute: 0 10*3/uL (ref 0.0–0.1)
Basophils Relative: 0 %
Eosinophils Absolute: 0 10*3/uL (ref 0.0–0.5)
Eosinophils Relative: 0 %
HCT: 41.3 % (ref 36.0–46.0)
Hemoglobin: 13.4 g/dL (ref 12.0–15.0)
Lymphocytes Relative: 10 %
Lymphs Abs: 0.4 10*3/uL — ABNORMAL LOW (ref 0.7–4.0)
MCH: 28.4 pg (ref 26.0–34.0)
MCHC: 32.4 g/dL (ref 30.0–36.0)
MCV: 87.5 fL (ref 80.0–100.0)
Monocytes Absolute: 0.2 10*3/uL (ref 0.1–1.0)
Monocytes Relative: 4 %
Neutro Abs: 3.4 10*3/uL (ref 1.7–7.7)
Neutrophils Relative %: 86 %
Platelets: 176 10*3/uL (ref 150–400)
RBC: 4.72 MIL/uL (ref 3.87–5.11)
RDW: 13 % (ref 11.5–15.5)
WBC: 4 10*3/uL (ref 4.0–10.5)
nRBC: 0 % (ref 0.0–0.2)
nRBC: 0 /100 WBC

## 2020-01-20 LAB — COMPREHENSIVE METABOLIC PANEL
ALT: 40 U/L (ref 0–44)
AST: 33 U/L (ref 15–41)
Albumin: 3.2 g/dL — ABNORMAL LOW (ref 3.5–5.0)
Alkaline Phosphatase: 74 U/L (ref 38–126)
Anion gap: 17 — ABNORMAL HIGH (ref 5–15)
BUN: 14 mg/dL (ref 6–20)
CO2: 19 mmol/L — ABNORMAL LOW (ref 22–32)
Calcium: 8.1 mg/dL — ABNORMAL LOW (ref 8.9–10.3)
Chloride: 95 mmol/L — ABNORMAL LOW (ref 98–111)
Creatinine, Ser: 0.84 mg/dL (ref 0.44–1.00)
GFR, Estimated: >60 mL/min (ref >60)
Glucose, Bld: 378 mg/dL — ABNORMAL HIGH (ref 70–99)
Potassium: 4.1 mmol/L (ref 3.5–5.1)
Sodium: 131 mmol/L — ABNORMAL LOW (ref 135–145)
Total Bilirubin: 1.1 mg/dL (ref 0.3–1.2)
Total Protein: 6.2 g/dL — ABNORMAL LOW (ref 6.5–8.1)

## 2020-01-20 LAB — CBG MONITORING, ED
Glucose-Capillary: 353 mg/dL — ABNORMAL HIGH (ref 70–99)
Glucose-Capillary: 364 mg/dL — ABNORMAL HIGH (ref 70–99)
Glucose-Capillary: 369 mg/dL — ABNORMAL HIGH (ref 70–99)

## 2020-01-20 LAB — TSH: TSH: 0.808 u[IU]/mL (ref 0.350–4.500)

## 2020-01-20 LAB — GLUCOSE, CAPILLARY: Glucose-Capillary: 367 mg/dL — ABNORMAL HIGH (ref 70–99)

## 2020-01-20 LAB — FERRITIN: Ferritin: 413 ng/mL — ABNORMAL HIGH (ref 11–307)

## 2020-01-20 LAB — PHOSPHORUS: Phosphorus: 4.2 mg/dL (ref 2.5–4.6)

## 2020-01-20 LAB — C-REACTIVE PROTEIN: CRP: 12.8 mg/dL — ABNORMAL HIGH (ref <1.0)

## 2020-01-20 LAB — D-DIMER, QUANTITATIVE: D-Dimer, Quant: 1.1 ug/mL-FEU — ABNORMAL HIGH (ref 0.00–0.50)

## 2020-01-20 LAB — MAGNESIUM: Magnesium: 2.2 mg/dL (ref 1.7–2.4)

## 2020-01-20 MED ORDER — INSULIN ASPART 100 UNIT/ML ~~LOC~~ SOLN
0.0000 [IU] | Freq: Three times a day (TID) | SUBCUTANEOUS | Status: DC
  Administered 2020-01-20 – 2020-01-21 (×4): 20 [IU] via SUBCUTANEOUS
  Administered 2020-01-21: 11 [IU] via SUBCUTANEOUS
  Administered 2020-01-22: 7 [IU] via SUBCUTANEOUS
  Administered 2020-01-22: 15 [IU] via SUBCUTANEOUS
  Administered 2020-01-22: 20 [IU] via SUBCUTANEOUS
  Administered 2020-01-23: 8 [IU] via SUBCUTANEOUS
  Administered 2020-01-23 – 2020-01-24 (×2): 4 [IU] via SUBCUTANEOUS

## 2020-01-20 MED ORDER — INSULIN ASPART 100 UNIT/ML ~~LOC~~ SOLN: 12.0000 [IU] | Freq: Three times a day (TID) | SUBCUTANEOUS | Status: DC

## 2020-01-20 MED ORDER — INSULIN ASPART 100 UNIT/ML ~~LOC~~ SOLN
10.0000 [IU] | Freq: Three times a day (TID) | SUBCUTANEOUS | Status: DC
  Administered 2020-01-20 (×2): 10 [IU] via SUBCUTANEOUS

## 2020-01-20 MED ORDER — METHYLPREDNISOLONE SODIUM SUCC 125 MG IJ SOLR
80.0000 mg | Freq: Two times a day (BID) | INTRAMUSCULAR | Status: DC
  Administered 2020-01-20 – 2020-01-23 (×6): 80 mg via INTRAVENOUS
  Filled 2020-01-20 (×6): qty 2

## 2020-01-20 MED ORDER — BENZONATATE 100 MG PO CAPS
200.0000 mg | ORAL_CAPSULE | Freq: Three times a day (TID) | ORAL | Status: DC
  Administered 2020-01-20 – 2020-01-24 (×12): 200 mg via ORAL
  Filled 2020-01-20 (×13): qty 2

## 2020-01-20 MED ORDER — TOCILIZUMAB 400 MG/20ML IV SOLN
800.0000 mg | Freq: Once | INTRAVENOUS | Status: AC
  Administered 2020-01-20: 800 mg via INTRAVENOUS
  Filled 2020-01-20: qty 40

## 2020-01-20 MED ORDER — ENOXAPARIN SODIUM 60 MG/0.6ML ~~LOC~~ SOLN
0.5000 mg/kg | SUBCUTANEOUS | Status: DC
  Administered 2020-01-20: 57.5 mg via SUBCUTANEOUS
  Filled 2020-01-20: qty 0.57
  Filled 2020-01-20: qty 0.6

## 2020-01-20 MED ORDER — METHYLPREDNISOLONE SODIUM SUCC 125 MG IJ SOLR: 0.5000 mg/kg | Freq: Two times a day (BID) | INTRAMUSCULAR | Status: DC

## 2020-01-20 MED ORDER — INSULIN DETEMIR 100 UNIT/ML ~~LOC~~ SOLN
15.0000 [IU] | Freq: Two times a day (BID) | SUBCUTANEOUS | Status: DC
  Administered 2020-01-20: 15 [IU] via SUBCUTANEOUS
  Filled 2020-01-20 (×5): qty 0.15

## 2020-01-20 NOTE — ED Notes (Signed)
Patient CBG was 364.

## 2020-01-20 NOTE — ED Notes (Signed)
Report called  Patient to be transported with all belongings 

## 2020-01-20 NOTE — ED Notes (Signed)
Pt is a hard stick, and IV wont draw back blood. Lab notified.

## 2020-01-20 NOTE — Progress Notes (Signed)
PROGRESS NOTE                                                                                                                                                                                                             Patient Demographics:    [name], is a 54 y.o. female, DOB - 21-Jan-1966, WSF:[id number]  Outpatient Primary MD for the patient is Assunta Found, MD   Admit date - 01/19/2020   LOS - 1  Chief Complaint  Patient presents with  . 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  COVID-19 vaccinated status: Unvaccinated  Significant Events: 12/29>> Admit to [facility] for hypoxemia due to COVID-19 pneumonia  Significant studies: 12/29>>Chest x-ray: Multifocal pneumonia  COVID-19 medications: Steroids: 12/29>> Remdesivir: 12/29>> Actemra: 12/30 x 1  Antibiotics: None  Microbiology data: 12/29 >>blood culture: No growth  Procedures: None  Consults: None  DVT prophylaxis: Prophylactic Lovenox   Subjective:    [name] today continues to have coughing spells-she was stable at rest-but O2 requirement had worsened overnight-she is now on 8 L of HFNC.   Assessment  & Plan :   Acute Hypoxic Resp Failure due to Covid 19 Viral pneumonia: Worsened overnight-now with severe hypoxemia-on 8 L of HFNC.  Continue steroids-but increase Solu-Medrol to 80 mg twice daily, remains on Remdesivir.  After extensive discussion of risks/benefits/rationale of Actemra-she has consented-she has no history of TB/hepatitis B or diverticulitis.  Have encouraged her to use incentive spirometry, and prone if possible.  Continue to monitor closely.  Fever: afebrile O2 requirements:  SpO2: 91 % O2 Flow Rate (L/min): 8 L/min    COVID-19 Labs: Recent Labs    01/19/20 2032 01/20/20 0749  DDIMER 1.24* 1.10*  FERRITIN 390* 413*  LDH 397*  --   CRP 12.1* 12.8*    No results found for: BNP  Recent Labs  Lab 01/19/20 2032  PROCALCITON <0.10    No results found for: SARSCOV2NAA    Prone/Incentive Spirometry: encouraged patient to lie prone for 3-4 hours at a time for a total of 16 hours a day, and to encourage incentive spirometry use 3-4/hour.  DM-2 (A1c 9.3 on  12/29) with steroid-induced hyperglycemia: CBGs remain uncontrolled-starting Levemir 15 units twice daily, 10 units of NovoLog with meals, change SSI to resistant scale.  Follow and adjust.  CBG (last 3)  Recent Labs    01/19/20 2235 01/20/20 0756  GLUCAP 372* 353*   HTN: BP controlled-continue losartan  Hypothyroidism: Continue Synthroid-TSH within normal limits  Anxiety: Relatively stable-continue Xanax-no longer on Wellbutrin.  Morbid obesity: Estimated body mass index is 42.27 kg/m as calculated from the following:   Height as of this encounter: 5\' 5"  (1.651 m).   Weight as of this encounter: 115.2 kg.    ABG:    Component Value Date/Time   TCO2 23 10/03/2012 1218    Vent Settings: N/A  Condition - Extremely Guarded  Family Communication  :  [name], [phone] updated over the phone 12/30  Code Status :  Full Code  Diet :  Diet Order            Diet Carb Modified Fluid consistency: Thin; Room service appropriate? Yes  Diet effective now                  Disposition Plan  :   Status is: Inpatient  Remains inpatient appropriate because:Inpatient level of care appropriate due to severity of illness   Dispo: The patient is from: Home              Anticipated d/c is to: Home              Anticipated d/c date is: > 3 days              Patient currently is not medically stable to d/c.   Barriers to discharge: Hypoxia requiring O2 supplementation/complete 5 days of IV  Remdesivir  Antimicorbials  :    Anti-infectives (From admission, onward)   Start     Dose/Rate Route Frequency Ordered Stop   01/20/20 1000  remdesivir 100 mg in sodium chloride 0.9 % 100 mL IVPB       "Followed by" Linked Group Details   100 mg 200 mL/hr over 30 Minutes Intravenous Daily 01/19/20 1639 01/24/20 0959   01/19/20 1645  remdesivir 200 mg in sodium chloride 0.9% 250 mL IVPB       "Followed by" Linked Group Details   200 mg 580 mL/hr over 30 Minutes Intravenous Once 01/19/20 1639 01/19/20 2146      Inpatient Medications  Scheduled Meds: . vitamin C  500 mg Oral Daily  . benzonatate  200 mg Oral TID  . enoxaparin (LOVENOX) injection  0.5 mg/kg Subcutaneous Q24H  . insulin aspart  0-20 Units Subcutaneous TID WC  . insulin aspart  0-5 Units Subcutaneous QHS  . insulin aspart  10 Units Subcutaneous TID WC  . insulin detemir  15 Units Subcutaneous BID  . levothyroxine  25 mcg Oral QAC breakfast  . losartan  100 mg Oral Daily  . methylPREDNISolone (SOLU-MEDROL) injection  80 mg Intravenous Q12H  . sodium chloride flush  3 mL Intravenous Q12H  . sodium chloride flush  3 mL Intravenous Q12H  . zinc sulfate  220 mg Oral Daily   Continuous Infusions: . sodium chloride    . remdesivir 100 mg in NS 100 mL     PRN Meds:.sodium chloride, acetaminophen, albuterol, ALPRAZolam, bisacodyl, guaiFENesin-dextromethorphan, ondansetron **OR** ondansetron (ZOFRAN) IV, oxyCODONE, polyethylene glycol, sodium chloride flush, sodium phosphate   Time Spent in minutes  35   See all Orders from today for further details  Jeoffrey Massed M.D on 01/20/2020 at 12:02 PM  To page go to [email or web address] - use universal password  [facility] -  Office  [phone]    Objective:   Vitals:   01/20/20 0915 01/20/20 0930 01/20/20 1000 01/20/20 1030  BP: 131/69 113/64 115/69 125/71  Pulse: 73 69 74 76  Resp: 20 18 19 16   Temp:      TempSrc:      SpO2: 90% 94% 95% 91%  Weight:       Height:        Wt Readings from Last 3 Encounters:  01/19/20 115.2 kg  02/14/15 117.5 kg  02/20/14 124.7 kg    No intake or output data in the 24 hours ending 01/20/20 1202   Physical Exam Gen Exam:Alert awake-not in any distress HEENT:atraumatic, normocephalic Chest: B/L clear to auscultation anteriorly CVS:S1S2 regular Abdomen:soft non tender, non distended Extremities:no edema Neurology: Non focal Skin: no rash   Data Review:    CBC Recent Labs  Lab 01/19/20 1136 01/20/20 0749  WBC 4.3 4.0  HGB 14.2 13.4  HCT 42.6 41.3  PLT 178 176  MCV 86.2 87.5  MCH 28.7 28.4  MCHC 33.3 32.4  RDW 13.1 13.0  LYMPHSABS  --  0.4*  MONOABS  --  0.2  EOSABS  --  0.0  BASOSABS  --  0.0    Chemistries  Recent Labs  Lab 01/19/20 1136 01/19/20 2032 01/20/20 0749  NA 130*  --  131*  K 3.6  --  4.1  CL 95*  --  95*  CO2 21*  --  19*  GLUCOSE 383*  --  378*  BUN 9  --  14  CREATININE 0.79  --  0.84  CALCIUM 8.1*  --  8.1*  MG  --   --  2.2  AST  --  48* 33  ALT  --  37 40  ALKPHOS  --  79 74  BILITOT  --  1.3* 1.1   ------------------------------------------------------------------------------------------------------------------ No results for input(s): CHOL, HDL, LDLCALC, TRIG, CHOLHDL, LDLDIRECT in the last 72 hours.  Lab Results  Component Value Date   HGBA1C 9.3 (H) 01/19/2020   ------------------------------------------------------------------------------------------------------------------ Recent Labs    01/20/20 0750  TSH 0.808   ------------------------------------------------------------------------------------------------------------------ Recent Labs    01/19/20 2032 01/20/20 0749  FERRITIN 390* 413*    Coagulation profile No results for input(s): INR, PROTIME in the last 168 hours.  Recent Labs    01/19/20 2032 01/20/20 0749  DDIMER 1.24* 1.10*    Cardiac Enzymes No results for input(s): CKMB, TROPONINI, MYOGLOBIN in the last 168  hours.  Invalid input(s): CK ------------------------------------------------------------------------------------------------------------------ No results found for: BNP  Micro Results Recent Results (from the past 240 hour(s))  Blood Culture (routine x 2)     Status: None (Preliminary result)   Collection Time: 01/19/20  8:32 PM   Specimen: BLOOD LEFT ARM  Result Value Ref Range Status   Specimen Description BLOOD LEFT ARM  Final   Special Requests   Final    BOTTLES DRAWN AEROBIC AND ANAEROBIC Blood Culture adequate volume   Culture   Final    NO GROWTH < 12 HOURS Performed at [facility], [address] [place], [place]    Report Status PENDING  Incomplete    Radiology Reports DG Chest 2 View  Result Date: 01/19/2020 CLINICAL DATA:  Fever, shortness of breath.  COVID positive. EXAM: CHEST - 2 VIEW COMPARISON:  September 14, 2015. FINDINGS:  Patchy bilateral airspace opacities. No visible pleural effusions or pneumothorax. Cardiomediastinal silhouette is within normal limits and similar to prior. Gastric band is in similar position. IMPRESSION: Patchy bilateral airspace opacities, compatible with multifocal pneumonia and reported COVID diagnosis. Electronically Signed   By: Feliberto HartsFrederick S Jones MD   On: 01/19/2020 12:58

## 2020-01-21 ENCOUNTER — Encounter (HOSPITAL_COMMUNITY): Payer: Self-pay | Admitting: Internal Medicine

## 2020-01-21 DIAGNOSIS — J9601 Acute respiratory failure with hypoxia: Secondary | ICD-10-CM | POA: Diagnosis not present

## 2020-01-21 DIAGNOSIS — E1169 Type 2 diabetes mellitus with other specified complication: Secondary | ICD-10-CM | POA: Diagnosis not present

## 2020-01-21 DIAGNOSIS — F419 Anxiety disorder, unspecified: Secondary | ICD-10-CM | POA: Diagnosis not present

## 2020-01-21 DIAGNOSIS — U071 COVID-19: Secondary | ICD-10-CM | POA: Diagnosis not present

## 2020-01-21 LAB — COMPREHENSIVE METABOLIC PANEL
ALT: 33 U/L (ref 0–44)
AST: 28 U/L (ref 15–41)
Albumin: 3 g/dL — ABNORMAL LOW (ref 3.5–5.0)
Alkaline Phosphatase: 78 U/L (ref 38–126)
Anion gap: 13 (ref 5–15)
BUN: 22 mg/dL — ABNORMAL HIGH (ref 6–20)
CO2: 22 mmol/L (ref 22–32)
Calcium: 8.7 mg/dL — ABNORMAL LOW (ref 8.9–10.3)
Chloride: 99 mmol/L (ref 98–111)
Creatinine, Ser: 0.69 mg/dL (ref 0.44–1.00)
GFR, Estimated: >60 mL/min (ref >60)
Glucose, Bld: 438 mg/dL — ABNORMAL HIGH (ref 70–99)
Potassium: 4.1 mmol/L (ref 3.5–5.1)
Sodium: 134 mmol/L — ABNORMAL LOW (ref 135–145)
Total Bilirubin: 0.4 mg/dL (ref 0.3–1.2)
Total Protein: 6.1 g/dL — ABNORMAL LOW (ref 6.5–8.1)

## 2020-01-21 LAB — GLUCOSE, CAPILLARY
Glucose-Capillary: 474 mg/dL — ABNORMAL HIGH (ref 70–99)
Glucose-Capillary: 371 mg/dL — ABNORMAL HIGH (ref 70–99)
Glucose-Capillary: 251 mg/dL — ABNORMAL HIGH (ref 70–99)
Glucose-Capillary: 258 mg/dL — ABNORMAL HIGH (ref 70–99)

## 2020-01-21 LAB — CBC WITH DIFFERENTIAL/PLATELET
Abs Immature Granulocytes: 0.14 10*3/uL — ABNORMAL HIGH (ref 0.00–0.07)
Basophils Absolute: 0 10*3/uL (ref 0.0–0.1)
Basophils Relative: 0 %
Eosinophils Absolute: 0 10*3/uL (ref 0.0–0.5)
Eosinophils Relative: 0 %
HCT: 39.5 % (ref 36.0–46.0)
Hemoglobin: 13.2 g/dL (ref 12.0–15.0)
Immature Granulocytes: 2 %
Lymphocytes Relative: 15 %
Lymphs Abs: 1 10*3/uL (ref 0.7–4.0)
MCH: 28.6 pg (ref 26.0–34.0)
MCHC: 33.4 g/dL (ref 30.0–36.0)
MCV: 85.7 fL (ref 80.0–100.0)
Monocytes Absolute: 0.3 10*3/uL (ref 0.1–1.0)
Monocytes Relative: 5 %
Neutro Abs: 5.2 10*3/uL (ref 1.7–7.7)
Neutrophils Relative %: 78 %
Platelets: 225 10*3/uL (ref 150–400)
RBC: 4.61 MIL/uL (ref 3.87–5.11)
RDW: 13 % (ref 11.5–15.5)
WBC: 6.7 10*3/uL (ref 4.0–10.5)
nRBC: 0 % (ref 0.0–0.2)

## 2020-01-21 LAB — MAGNESIUM: Magnesium: 2.2 mg/dL (ref 1.7–2.4)

## 2020-01-21 LAB — D-DIMER, QUANTITATIVE: D-Dimer, Quant: 0.75 ug/mL-FEU — ABNORMAL HIGH (ref 0.00–0.50)

## 2020-01-21 LAB — HEPATITIS B SURFACE ANTIGEN: Hepatitis B Surface Ag: NONREACTIVE

## 2020-01-21 LAB — C-REACTIVE PROTEIN: CRP: 7.7 mg/dL — ABNORMAL HIGH (ref <1.0)

## 2020-01-21 LAB — PHOSPHORUS: Phosphorus: 3.2 mg/dL (ref 2.5–4.6)

## 2020-01-21 LAB — FERRITIN: Ferritin: 471 ng/mL — ABNORMAL HIGH (ref 11–307)

## 2020-01-21 MED ORDER — ENOXAPARIN SODIUM 60 MG/0.6ML ~~LOC~~ SOLN
60.0000 mg | SUBCUTANEOUS | Status: DC
  Administered 2020-01-21 – 2020-01-22 (×2): 60 mg via SUBCUTANEOUS
  Filled 2020-01-21 (×3): qty 0.6

## 2020-01-21 MED ORDER — INSULIN ASPART 100 UNIT/ML ~~LOC~~ SOLN
15.0000 [IU] | Freq: Three times a day (TID) | SUBCUTANEOUS | Status: DC
  Administered 2020-01-21 – 2020-01-24 (×9): 15 [IU] via SUBCUTANEOUS

## 2020-01-21 MED ORDER — INSULIN DETEMIR 100 UNIT/ML ~~LOC~~ SOLN
20.0000 [IU] | Freq: Two times a day (BID) | SUBCUTANEOUS | Status: DC
  Filled 2020-01-21 (×2): qty 0.2

## 2020-01-21 MED ORDER — INSULIN DETEMIR 100 UNIT/ML ~~LOC~~ SOLN
25.0000 [IU] | Freq: Two times a day (BID) | SUBCUTANEOUS | Status: DC
  Administered 2020-01-21 (×2): 25 [IU] via SUBCUTANEOUS
  Filled 2020-01-21 (×4): qty 0.25

## 2020-01-21 NOTE — Plan of Care (Signed)
  Problem: Education: Goal: Knowledge of risk factors and measures for prevention of condition will improve Outcome: Progressing   Problem: Coping: Goal: Psychosocial and spiritual needs will be supported Outcome: Progressing   Problem: Respiratory: Goal: Will maintain a patent airway Outcome: Progressing   Problem: Safety: Goal: Ability to remain free from injury will improve Outcome: Progressing   Problem: Skin Integrity: Goal: Risk for impaired skin integrity will decrease Outcome: Progressing   

## 2020-01-21 NOTE — Progress Notes (Signed)
PROGRESS NOTE                                                                                                                                                                                                             Patient Demographics:    [name], is a 54 y.o. female, DOB - May 24, 1965, RFF:[id number]  Outpatient Primary MD for the patient is Assunta Found, MD   Admit date - 01/19/2020   LOS - 2  Chief Complaint  Patient presents with  . 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  COVID-19 vaccinated status: Unvaccinated  Significant Events: 12/29>> Admit to [facility] for hypoxemia due to COVID-19 pneumonia  Significant studies: 12/29>>Chest x-ray: Multifocal pneumonia  COVID-19 medications: Steroids: 12/29>> Remdesivir: 12/29>> Actemra: 12/30 x 1  Antibiotics: None  Microbiology data: 12/29 >>blood culture: No growth  Procedures: None  Consults: None  DVT prophylaxis: Prophylactic Lovenox   Subjective:   Feels better-remains on 8 L of oxygen.   Assessment  & Plan :   Acute Hypoxic Resp Failure due to Covid 19 Viral pneumonia: Continues to have severe hypoxemia-on 8 L of oxygen-continue steroids/Remdesivir.  Received Actemra on 12/30.  No evidence of volume overload-does not require diuretics.  Continue to monitor closely.   Fever: afebrile O2 requirements:  SpO2: (!) 89 % O2 Flow Rate (L/min): 8 L/min   COVID-19 Labs: Recent Labs    01/19/20 2032 01/20/20 0749 01/21/20 0334  DDIMER 1.24* 1.10* 0.75*  FERRITIN 390* 413* 471*  LDH 397*  --   --   CRP 12.1* 12.8* 7.7*    No results found for: BNP  Recent Labs  Lab 01/19/20 2032  PROCALCITON <0.10    No results found for: SARSCOV2NAA     Prone/Incentive Spirometry: encouraged patient to lie prone for 3-4 hours at a time for a total of 16 hours a day, and to encourage incentive spirometry use 3-4/hour.  DM-2 (A1c 9.3 on 12/29) with steroid-induced hyperglycemia: CBGs remain uncontrolled-increase Levemir to 25 units twice daily, Premeal NovoLog to 15 units, continue resistant SSI and follow.  If CBGs remain uncontrolled-we will start insulin infusion.    CBG (  last 3)  Recent Labs    01/20/20 2052 01/21/20 0838 01/21/20 1258  GLUCAP 367* 474* 371*   HTN: BP controlled-continue losartan  Hypothyroidism: Continue Synthroid-TSH within normal limits  Anxiety: Relatively stable-continue Xanax-no longer on Wellbutrin.  Morbid obesity: Estimated body mass index is 42.27 kg/m as calculated from the following:   Height as of this encounter: 5\' 5"  (1.651 m).   Weight as of this encounter: 115.2 kg.    ABG:    Component Value Date/Time   TCO2 23 10/03/2012 1218    Vent Settings: N/A  Condition - Extremely Guarded  Family Communication  :  Danessa Mensch, [phone] updated over the phone 12/31  Code Status :  Full Code  Diet :  Diet Order            Diet Carb Modified Fluid consistency: Thin; Room service appropriate? Yes  Diet effective now                  Disposition Plan  :   Status is: Inpatient  Remains inpatient appropriate because:Inpatient level of care appropriate due to severity of illness   Dispo: The patient is from: Home              Anticipated d/c is to: Home              Anticipated d/c date is: > 3 days              Patient currently is not medically stable to d/c.   Barriers to discharge: Hypoxia requiring O2 supplementation/complete 5 days of IV Remdesivir  Antimicorbials  :    Anti-infectives (From admission, onward)   Start     Dose/Rate Route Frequency Ordered Stop   01/20/20 1000  remdesivir 100 mg in sodium chloride 0.9 % 100 mL IVPB       "Followed by"  Linked Group Details   100 mg 200 mL/hr over 30 Minutes Intravenous Daily 01/19/20 1639 01/24/20 0959   01/19/20 1645  remdesivir 200 mg in sodium chloride 0.9% 250 mL IVPB       "Followed by" Linked Group Details   200 mg 580 mL/hr over 30 Minutes Intravenous Once 01/19/20 1639 01/19/20 2146      Inpatient Medications  Scheduled Meds: . vitamin C  500 mg Oral Daily  . benzonatate  200 mg Oral TID  . enoxaparin (LOVENOX) injection  60 mg Subcutaneous Q24H  . insulin aspart  0-20 Units Subcutaneous TID WC  . insulin aspart  0-5 Units Subcutaneous QHS  . insulin aspart  15 Units Subcutaneous TID WC  . insulin detemir  25 Units Subcutaneous BID  . levothyroxine  25 mcg Oral QAC breakfast  . losartan  100 mg Oral Daily  . methylPREDNISolone (SOLU-MEDROL) injection  80 mg Intravenous Q12H  . sodium chloride flush  3 mL Intravenous Q12H  . sodium chloride flush  3 mL Intravenous Q12H  . zinc sulfate  220 mg Oral Daily   Continuous Infusions: . sodium chloride    . remdesivir 100 mg in NS 100 mL 100 mg (01/21/20 0934)   PRN Meds:.sodium chloride, acetaminophen, albuterol, ALPRAZolam, bisacodyl, guaiFENesin-dextromethorphan, ondansetron **OR** ondansetron (ZOFRAN) IV, oxyCODONE, polyethylene glycol, sodium chloride flush, sodium phosphate   Time Spent in minutes  35   See all Orders from today for further details   01/23/20 M.D on 01/21/2020 at 2:04 PM  To page go to [email or web address] - use universal password  [facility] -  Office  [phone]    Objective:   Vitals:   01/21/20 0009 01/21/20 0409 01/21/20 0821 01/21/20 1300  BP: 128/77 (!) 118/47 (!) 145/73 (!) 129/57  Pulse: 70 71 68 88  Resp: 18 20  20   Temp: 97.8 F (36.6 C) 98.1 F (36.7 C) 98.7 F (37.1 C) 98.7 F (37.1 C)  TempSrc: Oral Oral Oral Oral  SpO2: 92% 90% 91% (!) 89%  Weight:      Height:        Wt Readings from Last 3 Encounters:  01/19/20 115.2 kg  02/14/15 117.5 kg  02/20/14  124.7 kg     Intake/Output Summary (Last 24 hours) at 01/21/2020 1404 Last data filed at 01/21/2020 0654 Gross per 24 hour  Intake 150 ml  Output 400 ml  Net -250 ml     Physical Exam Gen Exam:Alert awake-not in any distress HEENT:atraumatic, normocephalic Chest: B/L clear to auscultation anteriorly CVS:S1S2 regular Abdomen:soft non tender, non distended Extremities:no edema Neurology: Non focal Skin: no rash   Data Review:    CBC Recent Labs  Lab 01/19/20 1136 01/20/20 0749 01/21/20 0334  WBC 4.3 4.0 6.7  HGB 14.2 13.4 13.2  HCT 42.6 41.3 39.5  PLT 178 176 225  MCV 86.2 87.5 85.7  MCH 28.7 28.4 28.6  MCHC 33.3 32.4 33.4  RDW 13.1 13.0 13.0  LYMPHSABS  --  0.4* 1.0  MONOABS  --  0.2 0.3  EOSABS  --  0.0 0.0  BASOSABS  --  0.0 0.0    Chemistries  Recent Labs  Lab 01/19/20 1136 01/19/20 2032 01/20/20 0749 01/21/20 0334  NA 130*  --  131* 134*  K 3.6  --  4.1 4.1  CL 95*  --  95* 99  CO2 21*  --  19* 22  GLUCOSE 383*  --  378* 438*  BUN 9  --  14 22*  CREATININE 0.79  --  0.84 0.69  CALCIUM 8.1*  --  8.1* 8.7*  MG  --   --  2.2 2.2  AST  --  48* 33 28  ALT  --  37 40 33  ALKPHOS  --  79 74 78  BILITOT  --  1.3* 1.1 0.4   ------------------------------------------------------------------------------------------------------------------ No results for input(s): CHOL, HDL, LDLCALC, TRIG, CHOLHDL, LDLDIRECT in the last 72 hours.  Lab Results  Component Value Date   HGBA1C 9.3 (H) 01/19/2020   ------------------------------------------------------------------------------------------------------------------ Recent Labs    01/20/20 0750  TSH 0.808   ------------------------------------------------------------------------------------------------------------------ Recent Labs    01/20/20 0749 01/21/20 0334  FERRITIN 413* 471*    Coagulation profile No results for input(s): INR, PROTIME in the last 168 hours.  Recent Labs    01/20/20 0749  01/21/20 0334  DDIMER 1.10* 0.75*    Cardiac Enzymes No results for input(s): CKMB, TROPONINI, MYOGLOBIN in the last 168 hours.  Invalid input(s): CK ------------------------------------------------------------------------------------------------------------------ No results found for: BNP  Micro Results Recent Results (from the past 240 hour(s))  Blood Culture (routine x 2)     Status: None (Preliminary result)   Collection Time: 01/19/20  8:32 PM   Specimen: BLOOD LEFT ARM  Result Value Ref Range Status   Specimen Description BLOOD LEFT ARM  Final   Special Requests   Final    BOTTLES DRAWN AEROBIC AND ANAEROBIC Blood Culture adequate volume   Culture   Final    NO GROWTH 2 DAYS Performed at [facility], [address] [place], [place] [address]  Report Status PENDING  Incomplete    Radiology Reports DG Chest 2 View  Result Date: 01/19/2020 CLINICAL DATA:  Fever, shortness of breath.  COVID positive. EXAM: CHEST - 2 VIEW COMPARISON:  September 14, 2015. FINDINGS: Patchy bilateral airspace opacities. No visible pleural effusions or pneumothorax. Cardiomediastinal silhouette is within normal limits and similar to prior. Gastric band is in similar position. IMPRESSION: Patchy bilateral airspace opacities, compatible with multifocal pneumonia and reported COVID diagnosis. Electronically Signed   By: Feliberto Harts MD   On: 01/19/2020 12:58

## 2020-01-21 NOTE — Progress Notes (Signed)
Patient in prone position, independently at this time. Patient also using incentive spirometer independently with proper technique.

## 2020-01-21 NOTE — TOC Initial Note (Signed)
Transition of Care [facility]) - Initial/Assessment Note    Patient Details  Name: [name] MRN: [id number] Date of Birth: 02-Feb-1965  [facility]) CM/SW Contact:    Lockie Pares, RN Phone Number: 01/21/2020, 9:54 AM  Clinical Narrative:                 COVID pneumonia on oxygen, Lives with spouse, will likely need home oxygen.  Cm will follow for continuing needs/  Expected Discharge Plan: Home/Self Care Barriers to Discharge: Continued Medical Work up   Patient Goals and CMS Choice        Expected Discharge Plan and Services Expected Discharge Plan: Home/Self Care   Discharge Planning Services: CM Consult   Living arrangements for the past 2 months: Single Family Home                                      Prior Living Arrangements/Services Living arrangements for the past 2 months: Single Family Home Lives with:: Spouse Patient language and need for interpreter reviewed:: Yes        Need for Family Participation in Patient Care: Yes (Comment) Care giver support system in place?: Yes (comment)   Criminal Activity/Legal Involvement Pertinent to Current Situation/Hospitalization: No - Comment as needed  Activities of Daily Living Home Assistive Devices/Equipment: None ADL Screening (condition at time of admission) Patient's cognitive ability adequate to safely complete daily activities?: Yes Is the patient deaf or have difficulty hearing?: No Does the patient have difficulty seeing, even when wearing glasses/contacts?: No Does the patient have difficulty concentrating, remembering, or making decisions?: No Patient able to express need for assistance with ADLs?: Yes Does the patient have difficulty dressing or bathing?: No Independently performs ADLs?: Yes (appropriate for developmental age) Does the patient have difficulty walking or climbing stairs?: No Weakness of Legs: None Weakness of Arms/Hands: None  Permission  Sought/Granted                  Emotional Assessment       Orientation: : Oriented to Situation,Oriented to  Time,Oriented to Place,Oriented to Self Alcohol / Substance Use: Not Applicable Psych Involvement: No (comment)  Admission diagnosis:  Acute respiratory failure with hypoxia (HCC) [J96.01] Acute hypoxemic respiratory failure due to COVID-19 (HCC) [U07.1, J96.01] COVID-19 [U07.1] Patient Active Problem List   Diagnosis Date Noted  . Acute hypoxemic respiratory failure due to COVID-19 (HCC) 01/19/2020  . Hypothyroidism (acquired) 01/19/2020  . Diabetes mellitus type 2 in obese (HCC)   . Anxiety   . Essential hypertension    PCP:  Assunta Found, MD Pharmacy:   [facility] [place] - [address] [place] [address] Phone: [phone] Fax: [phone]     Social Determinants of Health (SDOH) Interventions    Readmission Risk Interventions No flowsheet data found.

## 2020-01-22 DIAGNOSIS — E1169 Type 2 diabetes mellitus with other specified complication: Secondary | ICD-10-CM | POA: Diagnosis not present

## 2020-01-22 DIAGNOSIS — J9601 Acute respiratory failure with hypoxia: Secondary | ICD-10-CM | POA: Diagnosis not present

## 2020-01-22 DIAGNOSIS — F419 Anxiety disorder, unspecified: Secondary | ICD-10-CM | POA: Diagnosis not present

## 2020-01-22 DIAGNOSIS — U071 COVID-19: Secondary | ICD-10-CM | POA: Diagnosis not present

## 2020-01-22 LAB — GLUCOSE, CAPILLARY
Glucose-Capillary: 239 mg/dL — ABNORMAL HIGH (ref 70–99)
Glucose-Capillary: 327 mg/dL — ABNORMAL HIGH (ref 70–99)
Glucose-Capillary: 370 mg/dL — ABNORMAL HIGH (ref 70–99)
Glucose-Capillary: 345 mg/dL — ABNORMAL HIGH (ref 70–99)

## 2020-01-22 LAB — COMPREHENSIVE METABOLIC PANEL
ALT: 32 U/L (ref 0–44)
AST: 30 U/L (ref 15–41)
Albumin: 3 g/dL — ABNORMAL LOW (ref 3.5–5.0)
Alkaline Phosphatase: 69 U/L (ref 38–126)
Anion gap: 10 (ref 5–15)
BUN: 21 mg/dL — ABNORMAL HIGH (ref 6–20)
CO2: 26 mmol/L (ref 22–32)
Calcium: 8.5 mg/dL — ABNORMAL LOW (ref 8.9–10.3)
Chloride: 102 mmol/L (ref 98–111)
Creatinine, Ser: 0.7 mg/dL (ref 0.44–1.00)
GFR, Estimated: >60 mL/min (ref >60)
Glucose, Bld: 236 mg/dL — ABNORMAL HIGH (ref 70–99)
Potassium: 3.9 mmol/L (ref 3.5–5.1)
Sodium: 138 mmol/L (ref 135–145)
Total Bilirubin: 0.7 mg/dL (ref 0.3–1.2)
Total Protein: 6.1 g/dL — ABNORMAL LOW (ref 6.5–8.1)

## 2020-01-22 LAB — CBC WITH DIFFERENTIAL/PLATELET
Abs Immature Granulocytes: 0.29 10*3/uL — ABNORMAL HIGH (ref 0.00–0.07)
Basophils Absolute: 0 10*3/uL (ref 0.0–0.1)
Basophils Relative: 0 %
Eosinophils Absolute: 0 10*3/uL (ref 0.0–0.5)
Eosinophils Relative: 0 %
HCT: 40.8 % (ref 36.0–46.0)
Hemoglobin: 13.6 g/dL (ref 12.0–15.0)
Immature Granulocytes: 2 %
Lymphocytes Relative: 10 %
Lymphs Abs: 1.3 10*3/uL (ref 0.7–4.0)
MCH: 28.6 pg (ref 26.0–34.0)
MCHC: 33.3 g/dL (ref 30.0–36.0)
MCV: 85.9 fL (ref 80.0–100.0)
Monocytes Absolute: 0.7 10*3/uL (ref 0.1–1.0)
Monocytes Relative: 5 %
Neutro Abs: 10.6 10*3/uL — ABNORMAL HIGH (ref 1.7–7.7)
Neutrophils Relative %: 83 %
Platelets: 301 10*3/uL (ref 150–400)
RBC: 4.75 MIL/uL (ref 3.87–5.11)
RDW: 13.1 % (ref 11.5–15.5)
WBC: 12.9 10*3/uL — ABNORMAL HIGH (ref 4.0–10.5)
nRBC: 0 % (ref 0.0–0.2)

## 2020-01-22 LAB — D-DIMER, QUANTITATIVE: D-Dimer, Quant: 0.68 ug/mL-FEU — ABNORMAL HIGH (ref 0.00–0.50)

## 2020-01-22 LAB — C-REACTIVE PROTEIN: CRP: 3.3 mg/dL — ABNORMAL HIGH (ref <1.0)

## 2020-01-22 LAB — FERRITIN: Ferritin: 347 ng/mL — ABNORMAL HIGH (ref 11–307)

## 2020-01-22 MED ORDER — INSULIN DETEMIR 100 UNIT/ML ~~LOC~~ SOLN
30.0000 [IU] | Freq: Two times a day (BID) | SUBCUTANEOUS | Status: DC
  Administered 2020-01-22 – 2020-01-24 (×5): 30 [IU] via SUBCUTANEOUS
  Filled 2020-01-22 (×8): qty 0.3

## 2020-01-22 NOTE — Progress Notes (Signed)
O2 sat 80% on 10L [place] while patient showering. Oxygen sat returned to 88% on 7L HFNC after she sat back down in chair.

## 2020-01-22 NOTE — Progress Notes (Signed)
PROGRESS NOTE                                                                                                                                                                                                             Patient Demographics:    [name], is a 55 y.o. female, DOB - 04/28/1965, VEH:[id number]  Outpatient Primary MD for the patient is Assunta Found, MD   Admit date - 01/19/2020   LOS - 3  Chief Complaint  Patient presents with  . 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  COVID-19 vaccinated status: Unvaccinated  Significant Events: 12/29>> Admit to [facility] for hypoxemia due to COVID-19 pneumonia  Significant studies: 12/29>>Chest x-ray: Multifocal pneumonia  COVID-19 medications: Steroids: 12/29>> Remdesivir: 12/29>> Actemra: 12/30 x 1  Antibiotics: None  Microbiology data: 12/29 >>blood culture: No growth  Procedures: None  Consults: None  DVT prophylaxis: Prophylactic Lovenox   Subjective:   Is starting to feel better-claims that when she ambulates in the room-she has significantly less shortness of breath than the past few days.  Is anxious to go home and is inquiring about discharge.   Assessment  & Plan :   Acute Hypoxic Resp Failure due to Covid 19 Viral pneumonia: Had severe hypoxemia-requiring up to 8 L of HFNC on initial presentation-slowly improving-down to 5 L of HFNC at rest.  No signs of volume overload-remains on steroid/Remdesivir.  Continue to monitor closely-continue attempts to slowly titrate down FiO2-although improved-she is likely not yet ready for discharge-as her oxygen requirements are still pretty high.      Fever: afebrile O2 requirements:  SpO2: (!) 87 % O2 Flow  Rate (L/min): 5 L/min   COVID-19 Labs: Recent Labs    01/19/20 2032 01/20/20 0749 01/21/20 0334 01/22/20 0450  DDIMER 1.24* 1.10* 0.75* 0.68*  FERRITIN 390* 413* 471* 347*  LDH 397*  --   --   --   CRP 12.1* 12.8* 7.7* 3.3*    No results found for: BNP  Recent Labs  Lab 01/19/20 2032  PROCALCITON <0.10    No results found for: SARSCOV2NAA    Prone/Incentive Spirometry: encouraged patient to lie  prone for 3-4 hours at a time for a total of 16 hours a day, and to encourage incentive spirometry use 3-4/hour.  DM-2 (A1c 9.3 on 12/29) with steroid-induced hyperglycemia: CBGs remain on the higher side-although better than the past few days-increase Levemir to 30 units twice daily, increase Premeal NovoLog to 15 units 3 times daily-continue SSI-we will follow and adjust.   CBG (last 3)  Recent Labs    01/21/20 2049 01/22/20 0812 01/22/20 1231  GLUCAP 258* 239* 327*   HTN: BP controlled-continue losartan  Hypothyroidism: Continue Synthroid-TSH within normal limits  Anxiety: Relatively stable-continue Xanax-no longer on Wellbutrin.  Morbid obesity: Estimated body mass index is 42.27 kg/m as calculated from the following:   Height as of this encounter: 5\' 5"  (1.651 m).   Weight as of this encounter: 115.2 kg.    ABG:    Component Value Date/Time   TCO2 23 10/03/2012 1218    Vent Settings: N/A  Condition - Extremely Guarded  Family Communication  :  Aeriel Boulay, [phone] updated over the phone 1/2  Code Status :  Full Code  Diet :  Diet Order            Diet Carb Modified Fluid consistency: Thin; Room service appropriate? Yes  Diet effective now                  Disposition Plan  :   Status is: Inpatient  Remains inpatient appropriate because:Inpatient level of care appropriate due to severity of illness   Dispo: The patient is from: Home              Anticipated d/c is to: Home              Anticipated d/c date is: > 3 days               Patient currently is not medically stable to d/c.   Barriers to discharge: Hypoxia requiring O2 supplementation/complete 5 days of IV Remdesivir  Antimicorbials  :    Anti-infectives (From admission, onward)   Start     Dose/Rate Route Frequency Ordered Stop   01/20/20 1000  remdesivir 100 mg in sodium chloride 0.9 % 100 mL IVPB       "Followed by" Linked Group Details   100 mg 200 mL/hr over 30 Minutes Intravenous Daily 01/19/20 1639 01/24/20 0959   01/19/20 1645  remdesivir 200 mg in sodium chloride 0.9% 250 mL IVPB       "Followed by" Linked Group Details   200 mg 580 mL/hr over 30 Minutes Intravenous Once 01/19/20 1639 01/19/20 2146      Inpatient Medications  Scheduled Meds: . vitamin C  500 mg Oral Daily  . benzonatate  200 mg Oral TID  . enoxaparin (LOVENOX) injection  60 mg Subcutaneous Q24H  . insulin aspart  0-20 Units Subcutaneous TID WC  . insulin aspart  0-5 Units Subcutaneous QHS  . insulin aspart  15 Units Subcutaneous TID WC  . insulin detemir  30 Units Subcutaneous BID  . levothyroxine  25 mcg Oral QAC breakfast  . losartan  100 mg Oral Daily  . methylPREDNISolone (SOLU-MEDROL) injection  80 mg Intravenous Q12H  . sodium chloride flush  3 mL Intravenous Q12H  . sodium chloride flush  3 mL Intravenous Q12H  . zinc sulfate  220 mg Oral Daily   Continuous Infusions: . sodium chloride    . remdesivir 100 mg in NS 100 mL 100 mg (01/22/20 0922)  PRN Meds:.sodium chloride, acetaminophen, albuterol, ALPRAZolam, bisacodyl, guaiFENesin-dextromethorphan, ondansetron **OR** ondansetron (ZOFRAN) IV, oxyCODONE, polyethylene glycol, sodium chloride flush, sodium phosphate   Time Spent in minutes  35   See all Orders from today for further details   Jeoffrey Massed M.D on 01/22/2020 at 2:56 PM  To page go to [email or web address] - use universal password  [facility] -  Office  [phone]    Objective:   Vitals:   01/22/20 0813 01/22/20 0926 01/22/20  1052 01/22/20 1233  BP: (!) 122/59   114/69  Pulse: 60   75  Resp:    17  Temp: 98.1 F (36.7 C)   98.5 F (36.9 C)  TempSrc: Oral   Oral  SpO2: (!) 88% (!) 89% (!) 88% (!) 87%  Weight:      Height:        Wt Readings from Last 3 Encounters:  01/19/20 115.2 kg  02/14/15 117.5 kg  02/20/14 124.7 kg     Intake/Output Summary (Last 24 hours) at 01/22/2020 1456 Last data filed at 01/22/2020 1346 Gross per 24 hour  Intake 240 ml  Output 1300 ml  Net -1060 ml     Physical Exam Gen Exam:Alert awake-not in any distress HEENT:atraumatic, normocephalic Chest: B/L clear to auscultation anteriorly CVS:S1S2 regular Abdomen:soft non tender, non distended Extremities:no edema Neurology: Non focal Skin: no rash   Data Review:    CBC Recent Labs  Lab 01/19/20 1136 01/20/20 0749 01/21/20 0334 01/22/20 0450  WBC 4.3 4.0 6.7 12.9*  HGB 14.2 13.4 13.2 13.6  HCT 42.6 41.3 39.5 40.8  PLT 178 176 225 301  MCV 86.2 87.5 85.7 85.9  MCH 28.7 28.4 28.6 28.6  MCHC 33.3 32.4 33.4 33.3  RDW 13.1 13.0 13.0 13.1  LYMPHSABS  --  0.4* 1.0 1.3  MONOABS  --  0.2 0.3 0.7  EOSABS  --  0.0 0.0 0.0  BASOSABS  --  0.0 0.0 0.0    Chemistries  Recent Labs  Lab 01/19/20 1136 01/19/20 2032 01/20/20 0749 01/21/20 0334 01/22/20 0450  NA 130*  --  131* 134* 138  K 3.6  --  4.1 4.1 3.9  CL 95*  --  95* 99 102  CO2 21*  --  19* 22 26  GLUCOSE 383*  --  378* 438* 236*  BUN 9  --  14 22* 21*  CREATININE 0.79  --  0.84 0.69 0.70  CALCIUM 8.1*  --  8.1* 8.7* 8.5*  MG  --   --  2.2 2.2  --   AST  --  48* 33 28 30  ALT  --  37 40 33 32  ALKPHOS  --  79 74 78 69  BILITOT  --  1.3* 1.1 0.4 0.7   ------------------------------------------------------------------------------------------------------------------ No results for input(s): CHOL, HDL, LDLCALC, TRIG, CHOLHDL, LDLDIRECT in the last 72 hours.  Lab Results  Component Value Date   HGBA1C 9.3 (H) 01/19/2020    ------------------------------------------------------------------------------------------------------------------ Recent Labs    01/20/20 0750  TSH 0.808   ------------------------------------------------------------------------------------------------------------------ Recent Labs    01/21/20 0334 01/22/20 0450  FERRITIN 471* 347*    Coagulation profile No results for input(s): INR, PROTIME in the last 168 hours.  Recent Labs    01/21/20 0334 01/22/20 0450  DDIMER 0.75* 0.68*    Cardiac Enzymes No results for input(s): CKMB, TROPONINI, MYOGLOBIN in the last 168 hours.  Invalid input(s): CK ------------------------------------------------------------------------------------------------------------------ No results found for: BNP  Micro Results Recent Results (from the past  240 hour(s))  Blood Culture (routine x 2)     Status: None (Preliminary result)   Collection Time: 01/19/20  8:32 PM   Specimen: BLOOD LEFT ARM  Result Value Ref Range Status   Specimen Description BLOOD LEFT ARM  Final   Special Requests   Final    BOTTLES DRAWN AEROBIC AND ANAEROBIC Blood Culture adequate volume   Culture   Final    NO GROWTH 3 DAYS Performed at [facility], [address], [place], [place] [address]    Report Status PENDING  Incomplete    Radiology Reports DG Chest 2 View  Result Date: 01/19/2020 CLINICAL DATA:  Fever, shortness of breath.  COVID positive. EXAM: CHEST - 2 VIEW COMPARISON:  September 14, 2015. FINDINGS: Patchy bilateral airspace opacities. No visible pleural effusions or pneumothorax. Cardiomediastinal silhouette is within normal limits and similar to prior. Gastric band is in similar position. IMPRESSION: Patchy bilateral airspace opacities, compatible with multifocal pneumonia and reported COVID diagnosis. Electronically Signed   By: Feliberto Harts MD   On: 01/19/2020 12:58

## 2020-01-22 NOTE — Progress Notes (Signed)
Ambulated patient in room on 6L and O2 sats dropped to 83%. Once patient sat back in chair, O2 sats came up to 89%on 5L.

## 2020-01-23 DIAGNOSIS — J9601 Acute respiratory failure with hypoxia: Secondary | ICD-10-CM | POA: Diagnosis not present

## 2020-01-23 DIAGNOSIS — U071 COVID-19: Secondary | ICD-10-CM | POA: Diagnosis not present

## 2020-01-23 LAB — CBC WITH DIFFERENTIAL/PLATELET
Abs Immature Granulocytes: 0.41 10*3/uL — ABNORMAL HIGH (ref 0.00–0.07)
Basophils Absolute: 0 10*3/uL (ref 0.0–0.1)
Basophils Relative: 0 %
Eosinophils Absolute: 0 10*3/uL (ref 0.0–0.5)
Eosinophils Relative: 0 %
HCT: 39.8 % (ref 36.0–46.0)
Hemoglobin: 13.3 g/dL (ref 12.0–15.0)
Immature Granulocytes: 3 %
Lymphocytes Relative: 9 %
Lymphs Abs: 1.2 10*3/uL (ref 0.7–4.0)
MCH: 28.8 pg (ref 26.0–34.0)
MCHC: 33.4 g/dL (ref 30.0–36.0)
MCV: 86.1 fL (ref 80.0–100.0)
Monocytes Absolute: 0.6 10*3/uL (ref 0.1–1.0)
Monocytes Relative: 5 %
Neutro Abs: 10.7 10*3/uL — ABNORMAL HIGH (ref 1.7–7.7)
Neutrophils Relative %: 83 %
Platelets: 326 10*3/uL (ref 150–400)
RBC: 4.62 MIL/uL (ref 3.87–5.11)
RDW: 13 % (ref 11.5–15.5)
WBC: 13 10*3/uL — ABNORMAL HIGH (ref 4.0–10.5)
nRBC: 0 % (ref 0.0–0.2)

## 2020-01-23 LAB — COMPREHENSIVE METABOLIC PANEL
ALT: 31 U/L (ref 0–44)
AST: 31 U/L (ref 15–41)
Albumin: 2.9 g/dL — ABNORMAL LOW (ref 3.5–5.0)
Alkaline Phosphatase: 78 U/L (ref 38–126)
Anion gap: 13 (ref 5–15)
BUN: 21 mg/dL — ABNORMAL HIGH (ref 6–20)
CO2: 22 mmol/L (ref 22–32)
Calcium: 8.4 mg/dL — ABNORMAL LOW (ref 8.9–10.3)
Chloride: 101 mmol/L (ref 98–111)
Creatinine, Ser: 0.71 mg/dL (ref 0.44–1.00)
GFR, Estimated: >60 mL/min (ref >60)
Glucose, Bld: 296 mg/dL — ABNORMAL HIGH (ref 70–99)
Potassium: 3.9 mmol/L (ref 3.5–5.1)
Sodium: 136 mmol/L (ref 135–145)
Total Bilirubin: 0.7 mg/dL (ref 0.3–1.2)
Total Protein: 5.5 g/dL — ABNORMAL LOW (ref 6.5–8.1)

## 2020-01-23 LAB — GLUCOSE, CAPILLARY
Glucose-Capillary: 195 mg/dL — ABNORMAL HIGH (ref 70–99)
Glucose-Capillary: 233 mg/dL — ABNORMAL HIGH (ref 70–99)
Glucose-Capillary: 366 mg/dL — ABNORMAL HIGH (ref 70–99)
Glucose-Capillary: 293 mg/dL — ABNORMAL HIGH (ref 70–99)

## 2020-01-23 LAB — D-DIMER, QUANTITATIVE: D-Dimer, Quant: 0.63 ug/mL-FEU — ABNORMAL HIGH (ref 0.00–0.50)

## 2020-01-23 LAB — C-REACTIVE PROTEIN: CRP: 2.2 mg/dL — ABNORMAL HIGH (ref <1.0)

## 2020-01-23 LAB — FERRITIN: Ferritin: 270 ng/mL (ref 11–307)

## 2020-01-23 MED ORDER — POTASSIUM CHLORIDE CRYS ER 20 MEQ PO TBCR
20.0000 meq | EXTENDED_RELEASE_TABLET | Freq: Once | ORAL | Status: AC
  Administered 2020-01-23: 20 meq via ORAL
  Filled 2020-01-23: qty 1

## 2020-01-23 MED ORDER — METHYLPREDNISOLONE SODIUM SUCC 125 MG IJ SOLR
80.0000 mg | Freq: Every day | INTRAMUSCULAR | Status: DC
  Administered 2020-01-24: 80 mg via INTRAVENOUS
  Filled 2020-01-23: qty 2

## 2020-01-23 MED ORDER — FUROSEMIDE 10 MG/ML IJ SOLN
40.0000 mg | Freq: Once | INTRAMUSCULAR | Status: AC
  Administered 2020-01-23: 40 mg via INTRAVENOUS
  Filled 2020-01-23: qty 4

## 2020-01-23 NOTE — Progress Notes (Signed)
PROGRESS NOTE                                                                                                                                                                                                             Patient Demographics:    [name], is a 55 y.o. female, DOB - 29-May-1965, XIP:[id number]  Outpatient Primary MD for the patient is Assunta Found, MD   Admit date - 01/19/2020   LOS - 4  Chief Complaint  Patient presents with  . 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  COVID-19 vaccinated status: Unvaccinated  Significant Events: 12/29>> Admit to [facility] for hypoxemia due to COVID-19 pneumonia  Significant studies: 12/29>>Chest x-ray: Multifocal pneumonia  COVID-19 medications: Steroids: 12/29>> Remdesivir: 12/29>> Actemra: 12/30 x 1  Antibiotics: None  Microbiology data: 12/29 >>blood culture: No growth  Procedures: None  Consults: None  DVT prophylaxis: Prophylactic Lovenox   Subjective:   Patient in bed, appears comfortable, denies any headache, no fever, no chest pain or pressure, improved shortness of breath , no abdominal pain. No focal weakness.    Assessment  & Plan :   Acute Hypoxic Resp Failure due to Covid 19 Viral pneumonia: Had severe hypoxemia-requiring up to 8 L of HFNC on initial presentation-slowly improving-down to 5 L of HFNC at rest.  No signs of volume overload-remains on steroid/Remdesivir.  Continue to monitor closely-continue attempts to slowly titrate down FiO2-although improved-she is likely not yet ready for discharge-as her oxygen requirements are still pretty high.  We will start tapering steroids.    Fever: afebrile O2 requirements:  SpO2: 94 % O2 Flow Rate  (L/min): 6 L/min   COVID-19 Labs: Recent Labs    01/21/20 0334 01/22/20 0450 01/23/20 0156  DDIMER 0.75* 0.68* 0.63*  FERRITIN 471* 347* 270  CRP 7.7* 3.3* 2.2*    No results found for: BNP  Recent Labs  Lab 01/19/20 2032  PROCALCITON <0.10    No results found for: SARSCOV2NAA    Prone/Incentive Spirometry: encouraged patient to lie prone for 3-4 hours at a time for a total of 16 hours a day, and to encourage incentive spirometry use  3-4/hour.  DM-2 (A1c 9.3 on 12/29) with steroid-induced hyperglycemia: CBGs remain on the higher side-although better than the past few days-increase Levemir to 30 units twice daily, increase Premeal NovoLog to 15 units 3 times daily-continue SSI-we will follow and adjust.  We have started to taper steroids on 01/23/2020 will monitor.  CBG (last 3)  Recent Labs    01/22/20 1722 01/22/20 2111 01/23/20 0735  GLUCAP 370* 345* 195*   HTN: BP controlled-continue losartan  Hypothyroidism: Continue Synthroid-TSH within normal limits  Anxiety: Relatively stable-continue Xanax-no longer on Wellbutrin.  Morbid obesity: Estimated body mass index is 42.27 kg/m as calculated from the following:   Height as of this encounter: 5\' 5"  (1.651 m).   Weight as of this encounter: 115.2 kg.    Condition - Extremely Guarded  Family Communication  :  Nyiesha Beever, [phone] updated over the phone 01/22/20  Code Status :  Full Code  Diet :  Diet Order            Diet Carb Modified Fluid consistency: Thin; Room service appropriate? Yes  Diet effective now                  Disposition Plan  :   Status is: Inpatient  Remains inpatient appropriate because:Inpatient level of care appropriate due to severity of illness   Dispo: The patient is from: Home              Anticipated d/c is to: Home              Anticipated d/c date is: > 3 days              Patient currently is not medically stable to d/c.   Barriers to discharge:  Hypoxia requiring O2 supplementation/complete 5 days of IV Remdesivir  Antimicorbials  :    Anti-infectives (From admission, onward)   Start     Dose/Rate Route Frequency Ordered Stop   01/20/20 1000  remdesivir 100 mg in sodium chloride 0.9 % 100 mL IVPB       "Followed by" Linked Group Details   100 mg 200 mL/hr over 30 Minutes Intravenous Daily 01/19/20 1639 01/23/20 0924   01/19/20 1645  remdesivir 200 mg in sodium chloride 0.9% 250 mL IVPB       "Followed by" Linked Group Details   200 mg 580 mL/hr over 30 Minutes Intravenous Once 01/19/20 1639 01/19/20 2146      Inpatient Medications  Scheduled Meds: . vitamin C  500 mg Oral Daily  . benzonatate  200 mg Oral TID  . enoxaparin (LOVENOX) injection  60 mg Subcutaneous Q24H  . insulin aspart  0-20 Units Subcutaneous TID WC  . insulin aspart  0-5 Units Subcutaneous QHS  . insulin aspart  15 Units Subcutaneous TID WC  . insulin detemir  30 Units Subcutaneous BID  . levothyroxine  25 mcg Oral QAC breakfast  . losartan  100 mg Oral Daily  . methylPREDNISolone (SOLU-MEDROL) injection  80 mg Intravenous Q12H  . sodium chloride flush  3 mL Intravenous Q12H  . sodium chloride flush  3 mL Intravenous Q12H  . zinc sulfate  220 mg Oral Daily   Continuous Infusions: . sodium chloride     PRN Meds:.sodium chloride, acetaminophen, albuterol, ALPRAZolam, bisacodyl, guaiFENesin-dextromethorphan, ondansetron **OR** ondansetron (ZOFRAN) IV, oxyCODONE, polyethylene glycol, sodium chloride flush, sodium phosphate   Time Spent in minutes  35   See all Orders from today for further details   Sharayah Renfrow  Candiss Norse M.D on 01/23/2020 at 10:08 AM  To page go to [email or web address] - use universal password  [facility] -  Office  [phone]    Objective:   Vitals:   01/22/20 1800 01/22/20 1833 01/22/20 2110 01/23/20 0519  BP:   (!) 147/79 140/82  Pulse:   69 63  Resp:   16 20  Temp:   98.5 F (36.9 C) 98.5 F (36.9 C)  TempSrc:   Oral  Oral  SpO2: (!) 89% 91% 91% 94%  Weight:      Height:        Wt Readings from Last 3 Encounters:  01/19/20 115.2 kg  02/14/15 117.5 kg  02/20/14 124.7 kg     Intake/Output Summary (Last 24 hours) at 01/23/2020 1008 Last data filed at 01/22/2020 1346 Gross per 24 hour  Intake 360 ml  Output 300 ml  Net 60 ml     Physical Exam  Awake Alert, No new F.N deficits, Normal affect [place].AT,PERRAL Supple Neck,No JVD, No cervical lymphadenopathy appriciated.  Symmetrical Chest wall movement, Good air movement bilaterally, few rales RRR,No Gallops, Rubs or new Murmurs, No Parasternal Heave +ve B.Sounds, Abd Soft, No tenderness, No organomegaly appriciated, No rebound - guarding or rigidity. No Cyanosis, Clubbing or edema, No new Rash or bruise    Data Review:    CBC Recent Labs  Lab 01/19/20 1136 01/20/20 0749 01/21/20 0334 01/22/20 0450 01/23/20 0156  WBC 4.3 4.0 6.7 12.9* 13.0*  HGB 14.2 13.4 13.2 13.6 13.3  HCT 42.6 41.3 39.5 40.8 39.8  PLT 178 176 225 301 326  MCV 86.2 87.5 85.7 85.9 86.1  MCH 28.7 28.4 28.6 28.6 28.8  MCHC 33.3 32.4 33.4 33.3 33.4  RDW 13.1 13.0 13.0 13.1 13.0  LYMPHSABS  --  0.4* 1.0 1.3 1.2  MONOABS  --  0.2 0.3 0.7 0.6  EOSABS  --  0.0 0.0 0.0 0.0  BASOSABS  --  0.0 0.0 0.0 0.0    Chemistries  Recent Labs  Lab 01/19/20 1136 01/19/20 2032 01/20/20 0749 01/21/20 0334 01/22/20 0450 01/23/20 0156  NA 130*  --  131* 134* 138 136  K 3.6  --  4.1 4.1 3.9 3.9  CL 95*  --  95* 99 102 101  CO2 21*  --  19* 22 26 22   GLUCOSE 383*  --  378* 438* 236* 296*  BUN 9  --  14 22* 21* 21*  CREATININE 0.79  --  0.84 0.69 0.70 0.71  CALCIUM 8.1*  --  8.1* 8.7* 8.5* 8.4*  MG  --   --  2.2 2.2  --   --   AST  --  48* 33 28 30 31   ALT  --  37 40 33 32 31  ALKPHOS  --  79 74 78 69 78  BILITOT  --  1.3* 1.1 0.4 0.7 0.7   ------------------------------------------------------------------------------------------------------------------ No results for  input(s): CHOL, HDL, LDLCALC, TRIG, CHOLHDL, LDLDIRECT in the last 72 hours.  Lab Results  Component Value Date   HGBA1C 9.3 (H) 01/19/2020   ------------------------------------------------------------------------------------------------------------------ No results for input(s): TSH, T4TOTAL, T3FREE, THYROIDAB in the last 72 hours.  Invalid input(s): FREET3 ------------------------------------------------------------------------------------------------------------------ Recent Labs    01/22/20 0450 01/23/20 0156  FERRITIN 347* 270    Coagulation profile No results for input(s): INR, PROTIME in the last 168 hours.  Recent Labs    01/22/20 0450 01/23/20 0156  DDIMER 0.68* 0.63*    Cardiac Enzymes No results for input(s): CKMB,  TROPONINI, MYOGLOBIN in the last 168 hours.  Invalid input(s): CK ------------------------------------------------------------------------------------------------------------------ No results found for: BNP  Micro Results Recent Results (from the past 240 hour(s))  Blood Culture (routine x 2)     Status: None (Preliminary result)   Collection Time: 01/19/20  8:32 PM   Specimen: BLOOD LEFT ARM  Result Value Ref Range Status   Specimen Description BLOOD LEFT ARM  Final   Special Requests   Final    BOTTLES DRAWN AEROBIC AND ANAEROBIC Blood Culture adequate volume   Culture   Final    NO GROWTH 4 DAYS Performed at [facility], [address], [place], [place] [address]    Report Status PENDING  Incomplete    Radiology Reports DG Chest 2 View  Result Date: 01/19/2020 CLINICAL DATA:  Fever, shortness of breath.  COVID positive. EXAM: CHEST - 2 VIEW COMPARISON:  September 14, 2015. FINDINGS: Patchy bilateral airspace opacities. No visible pleural effusions or pneumothorax. Cardiomediastinal silhouette is within normal limits and similar to prior. Gastric band is in similar position. IMPRESSION: Patchy bilateral airspace opacities, compatible  with multifocal pneumonia and reported COVID diagnosis. Electronically Signed   By: Feliberto Harts MD   On: 01/19/2020 12:58

## 2020-01-24 ENCOUNTER — Inpatient Hospital Stay (HOSPITAL_COMMUNITY): Payer: No Typology Code available for payment source

## 2020-01-24 DIAGNOSIS — U071 COVID-19: Secondary | ICD-10-CM | POA: Diagnosis not present

## 2020-01-24 DIAGNOSIS — J9601 Acute respiratory failure with hypoxia: Secondary | ICD-10-CM | POA: Diagnosis not present

## 2020-01-24 LAB — COMPREHENSIVE METABOLIC PANEL
ALT: 28 U/L (ref 0–44)
AST: 22 U/L (ref 15–41)
Albumin: 2.9 g/dL — ABNORMAL LOW (ref 3.5–5.0)
Alkaline Phosphatase: 67 U/L (ref 38–126)
Anion gap: 9 (ref 5–15)
BUN: 23 mg/dL — ABNORMAL HIGH (ref 6–20)
CO2: 25 mmol/L (ref 22–32)
Calcium: 8.2 mg/dL — ABNORMAL LOW (ref 8.9–10.3)
Chloride: 102 mmol/L (ref 98–111)
Creatinine, Ser: 0.79 mg/dL (ref 0.44–1.00)
GFR, Estimated: >60 mL/min (ref >60)
Glucose, Bld: 197 mg/dL — ABNORMAL HIGH (ref 70–99)
Potassium: 3.6 mmol/L (ref 3.5–5.1)
Sodium: 136 mmol/L (ref 135–145)
Total Bilirubin: 0.6 mg/dL (ref 0.3–1.2)
Total Protein: 5.5 g/dL — ABNORMAL LOW (ref 6.5–8.1)

## 2020-01-24 LAB — CBC WITH DIFFERENTIAL/PLATELET
Abs Immature Granulocytes: 0.76 10*3/uL — ABNORMAL HIGH (ref 0.00–0.07)
Basophils Absolute: 0.1 10*3/uL (ref 0.0–0.1)
Basophils Relative: 1 %
Eosinophils Absolute: 0 10*3/uL (ref 0.0–0.5)
Eosinophils Relative: 0 %
HCT: 40.5 % (ref 36.0–46.0)
Hemoglobin: 13.5 g/dL (ref 12.0–15.0)
Immature Granulocytes: 6 %
Lymphocytes Relative: 15 %
Lymphs Abs: 2 10*3/uL (ref 0.7–4.0)
MCH: 28.6 pg (ref 26.0–34.0)
MCHC: 33.3 g/dL (ref 30.0–36.0)
MCV: 85.8 fL (ref 80.0–100.0)
Monocytes Absolute: 0.9 10*3/uL (ref 0.1–1.0)
Monocytes Relative: 7 %
Neutro Abs: 9.8 10*3/uL — ABNORMAL HIGH (ref 1.7–7.7)
Neutrophils Relative %: 71 %
Platelets: 349 10*3/uL (ref 150–400)
RBC: 4.72 MIL/uL (ref 3.87–5.11)
RDW: 13 % (ref 11.5–15.5)
WBC: 13.5 10*3/uL — ABNORMAL HIGH (ref 4.0–10.5)
nRBC: 0 % (ref 0.0–0.2)

## 2020-01-24 LAB — CULTURE, BLOOD (ROUTINE X 2)
Culture: NO GROWTH
Special Requests: ADEQUATE

## 2020-01-24 LAB — GLUCOSE, CAPILLARY
Glucose-Capillary: 109 mg/dL — ABNORMAL HIGH (ref 70–99)
Glucose-Capillary: 164 mg/dL — ABNORMAL HIGH (ref 70–99)

## 2020-01-24 LAB — MAGNESIUM: Magnesium: 2.4 mg/dL (ref 1.7–2.4)

## 2020-01-24 LAB — C-REACTIVE PROTEIN: CRP: 1.5 mg/dL — ABNORMAL HIGH (ref <1.0)

## 2020-01-24 MED ORDER — ALBUTEROL SULFATE HFA 108 (90 BASE) MCG/ACT IN AERS: 1.0000 | INHALATION_SPRAY | RESPIRATORY_TRACT | 0 refills | Status: DC | PRN

## 2020-01-24 NOTE — Progress Notes (Signed)
Inpatient Diabetes Program Recommendations  AACE/ADA: New Consensus Statement on Inpatient Glycemic Control   Target Ranges:  Prepandial:   less than 140 mg/dL      Peak postprandial:   less than 180 mg/dL (1-2 hours)      Critically ill patients:  140 - 180 mg/dL  Results for [name], [name] (MRN [id number]) as of 01/24/2020 12:06  Ref. Range 01/23/2020 07:35 01/23/2020 11:32 01/23/2020 16:42 01/23/2020 21:24 01/24/2020 08:10  Glucose-Capillary Latest Ref Range: 70 - 99 mg/dL 212 (H) 248 (H) 250 (H) 293 (H) 109 (H)   Results for [name], [name] (MRN [id number]) as of 01/24/2020 12:06  Ref. Range 01/19/2020 20:32  Hemoglobin A1C Latest Ref Range: 4.8 - 5.6 % 9.3 (H)   Review of Glycemic Control  Diabetes history: DM2 Outpatient Diabetes medications: Metformin 1000 mg QAM Current orders for Inpatient glycemic control: Levemir 30 units BID, Novolog 15 units TID with meals, Novolog 0-20 units TID with meals, Novolog 0-5 units QHS; Solumedrol 80 mg daily  Inpatient Diabetes Program Recommendations:    HbgA1C:  A1C 9.3% on 01/19/20 indicating an average glucose of 220 mg/dl over the past 2-3 months.  NOTE: Spoke with patient over the phone about diabetes and home regimen for diabetes control. Patient reports being followed by PCP for diabetes management and currently taking Metformin as an outpatient for diabetes control. Patient reports taking DM medications as prescribed and reports that her last A1C was in the 5% range. Patient admits that she has not followed up with PCP lately but she notes that she has plenty of Metformin on hand and testing supplies.  Patient reports that she was not checking her glucose very often at home and she notes that she has gained weight over the past few months.  Discussed A1C results (9.3% on 01/19/20) and explained that current A1C indicates an average glucose of 220 mg/dl over the past 2-3 months. Discussed glucose and A1C goals. Discussed importance of checking  CBGs and maintaining good CBG control to prevent long-term and short-term complications. Explained how hyperglycemia leads to damage within blood vessels which lead to the common complications seen with uncontrolled diabetes. Stressed to the patient the importance of improving glycemic control to prevent further complications from uncontrolled diabetes. Discussed impact of nutrition, exercise, stress, sickness, and medications on diabetes control.  Discussed impact of steroids and explained that steroids may keep glucose elevated until completely cleared.  Encouraged patient to follow up with PCP regarding DM control and to reach out to PCP if glucose is still consistently over 180 mg/dl at home.   Patient verbalized understanding of information discussed and reports no further questions at this time related to diabetes.  Thanks, Orlando Penner, RN, MSN, CDE Diabetes Coordinator Inpatient Diabetes Program [phone] (Team Pager)

## 2020-01-24 NOTE — Progress Notes (Signed)
Patient requesting to be discharged today despite still being on 6L [place]. Patient spoke with MD and is refusing to stay past today.

## 2020-01-24 NOTE — Progress Notes (Signed)
SATURATION QUALIFICATIONS: (This note is used to comply with regulatory documentation for home oxygen)  Patient Saturations on Room Air at Rest = 80%  Patient Saturations on 10 Liters of oxygen while Ambulating = 90%  Please briefly explain why patient needs home oxygen:  Patient requires 6L of oxygen during rest to maintain sats 91-92%. Oxygen demands increase to 8-10L during ambulation to maintain oxygen sats above 89%.

## 2020-01-24 NOTE — Discharge Instructions (Signed)
Follow with Primary MD Assunta Found, MD in 7 days   Get CBC, CMP, 2 view Chest X ray -  checked next visit within 1 week by Primary MD   Activity: As tolerated with Full fall precautions use walker/cane & assistance as needed  Disposition Home    Diet: Low Carb    Special Instructions: If you have smoked or chewed Tobacco  in the last 2 yrs please stop smoking, stop any regular Alcohol  and or any Recreational drug use.  On your next visit with your primary care physician please Get Medicines reviewed and adjusted.  Please request your Prim.MD to go over all Hospital Tests and Procedure/Radiological results at the follow up, please get all Hospital records sent to your Prim MD by signing hospital release before you go home.  If you experience worsening of your admission symptoms, develop shortness of breath, life threatening emergency, suicidal or homicidal thoughts you must seek medical attention immediately by calling 911 or calling your MD immediately  if symptoms less severe.  You Must read complete instructions/literature along with all the possible adverse reactions/side effects for all the Medicines you take and that have been prescribed to you. Take any new Medicines after you have completely understood and accpet all the possible adverse reactions/side effects.       Person Under Monitoring Name: [name]  Location: [address] [place] [address]   Infection Prevention Recommendations for Individuals Confirmed to have, or Being Evaluated for, 2019 Novel Coronavirus (COVID-19) Infection Who Receive Care at Home  Individuals who are confirmed to have, or are being evaluated for, COVID-19 should follow the prevention steps below until a healthcare provider or local or state health department says they can return to normal activities.  Stay home except to get medical care You should restrict activities outside your home, except for getting medical  care. Do not go to work, school, or public areas, and do not use public transportation or taxis.  Call ahead before visiting your doctor Before your medical appointment, call the healthcare provider and tell them that you have, or are being evaluated for, COVID-19 infection. This will help the healthcare provider's office take steps to keep other people from getting infected. Ask your healthcare provider to call the local or state health department.  Monitor your symptoms Seek prompt medical attention if your illness is worsening (e.g., difficulty breathing). Before going to your medical appointment, call the healthcare provider and tell them that you have, or are being evaluated for, COVID-19 infection. Ask your healthcare provider to call the local or state health department.  Wear a facemask You should wear a facemask that covers your nose and mouth when you are in the same room with other people and when you visit a healthcare provider. People who live with or visit you should also wear a facemask while they are in the same room with you.  Separate yourself from other people in your home As much as possible, you should stay in a different room from other people in your home. Also, you should use a separate bathroom, if available.  Avoid sharing household items You should not share dishes, drinking glasses, cups, eating utensils, towels, bedding, or other items with other people in your home. After using these items, you should wash them thoroughly with soap and water.  Cover your coughs and sneezes Cover your mouth and nose with a tissue when you cough or sneeze, or you can cough or  sneeze into your sleeve. Throw used tissues in a lined trash can, and immediately wash your hands with soap and water for at least 20 seconds or use an alcohol-based hand rub.  Wash your [facility] your hands often and thoroughly with soap and water for at least 20 seconds. You can use an alcohol-based  hand sanitizer if soap and water are not available and if your hands are not visibly dirty. Avoid touching your eyes, nose, and mouth with unwashed hands.   Prevention Steps for Caregivers and Household Members of Individuals Confirmed to have, or Being Evaluated for, COVID-19 Infection Being Cared for in the Home  If you live with, or provide care at home for, a person confirmed to have, or being evaluated for, COVID-19 infection please follow these guidelines to prevent infection:  Follow healthcare provider's instructions Make sure that you understand and can help the patient follow any healthcare provider instructions for all care.  Provide for the patient's basic needs You should help the patient with basic needs in the home and provide support for getting groceries, prescriptions, and other personal needs.  Monitor the patient's symptoms If they are getting sicker, call his or her medical provider and tell them that the patient has, or is being evaluated for, COVID-19 infection. This will help the healthcare provider's office take steps to keep other people from getting infected. Ask the healthcare provider to call the local or state health department.  Limit the number of people who have contact with the patient  If possible, have only one caregiver for the patient.  Other household members should stay in another home or place of residence. If this is not possible, they should stay  in another room, or be separated from the patient as much as possible. Use a separate bathroom, if available.  Restrict visitors who do not have an essential need to be in the home.  Keep older adults, very young children, and other sick people away from the patient Keep older adults, very young children, and those who have compromised immune systems or chronic health conditions away from the patient. This includes people with chronic heart, lung, or kidney conditions, diabetes, and  cancer.  Ensure good ventilation Make sure that shared spaces in the home have good air flow, such as from an air conditioner or an opened window, weather permitting.  Wash your hands often  Wash your hands often and thoroughly with soap and water for at least 20 seconds. You can use an alcohol based hand sanitizer if soap and water are not available and if your hands are not visibly dirty.  Avoid touching your eyes, nose, and mouth with unwashed hands.  Use disposable paper towels to dry your hands. If not available, use dedicated cloth towels and replace them when they become wet.  Wear a facemask and gloves  Wear a disposable facemask at all times in the room and gloves when you touch or have contact with the patient's blood, body fluids, and/or secretions or excretions, such as sweat, saliva, sputum, nasal mucus, vomit, urine, or feces.  Ensure the mask fits over your nose and mouth tightly, and do not touch it during use.  Throw out disposable facemasks and gloves after using them. Do not reuse.  Wash your hands immediately after removing your facemask and gloves.  If your personal clothing becomes contaminated, carefully remove clothing and launder. Wash your hands after handling contaminated clothing.  Place all used disposable facemasks, gloves, and other waste  in a lined container before disposing them with other household waste.  Remove gloves and wash your hands immediately after handling these items.  Do not share dishes, glasses, or other household items with the patient  Avoid sharing household items. You should not share dishes, drinking glasses, cups, eating utensils, towels, bedding, or other items with a patient who is confirmed to have, or being evaluated for, COVID-19 infection.  After the person uses these items, you should wash them thoroughly with soap and water.  Wash laundry thoroughly  Immediately remove and wash clothes or bedding that have blood, body  fluids, and/or secretions or excretions, such as sweat, saliva, sputum, nasal mucus, vomit, urine, or feces, on them.  Wear gloves when handling laundry from the patient.  Read and follow directions on labels of laundry or clothing items and detergent. In general, wash and dry with the warmest temperatures recommended on the label.  Clean all areas the individual has used often  Clean all touchable surfaces, such as counters, tabletops, doorknobs, bathroom fixtures, toilets, phones, keyboards, tablets, and bedside tables, every day. Also, clean any surfaces that may have blood, body fluids, and/or secretions or excretions on them.  Wear gloves when cleaning surfaces the patient has come in contact with.  Use a diluted bleach solution (e.g., dilute bleach with 1 part bleach and 10 parts water) or a household disinfectant with a label that says EPA-registered for coronaviruses. To make a bleach solution at home, add 1 tablespoon of bleach to 1 quart (4 cups) of water. For a larger supply, add  cup of bleach to 1 gallon (16 cups) of water.  Read labels of cleaning products and follow recommendations provided on product labels. Labels contain instructions for safe and effective use of the cleaning product including precautions you should take when applying the product, such as wearing gloves or eye protection and making sure you have good ventilation during use of the product.  Remove gloves and wash hands immediately after cleaning.  Monitor yourself for signs and symptoms of illness Caregivers and household members are considered close contacts, should monitor their health, and will be asked to limit movement outside of the home to the extent possible. Follow the monitoring steps for close contacts listed on the symptom monitoring form.   ? If you have additional questions, contact your local health department or call the epidemiologist on call at [phone] (available 24/7). ? This  guidance is subject to change. For the most up-to-date guidance from [facility], please refer to their website: [employer]hu

## 2020-01-24 NOTE — Discharge Summary (Signed)
[name] RWE:[id number] DOB: 10/03/1965 DOA: 01/19/2020  PCP: Marie Sites, MD  Admit date: 01/19/2020  Discharge date: 01/24/2020  Admitted From: Home   Disposition:  Home   Recommendations for Outpatient Follow-up:   Follow up with PCP in 1-2 weeks  PCP Please obtain BMP/CBC, 2 view CXR in 1week,  (see Discharge instructions)   PCP Please follow up on the following pending results: Check CBC, CMP and a two-view chest x-ray in 7 to 10 days   Home Health: None Equipment/Devices: 6 L nasal cannula oxygen at home Consultations: None  Discharge Condition: Stable    CODE STATUS: Full    Diet Recommendation:  Diet Order            Diet Carb Modified Fluid consistency: Thin; Room service appropriate? Yes  Diet effective now                  Chief Complaint  Patient presents with  . 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  COVID-19 vaccinated status: Unvaccinated  Significant Events: 12/29>> Admit to [facility] for hypoxemia due to COVID-19 pneumonia  Significant studies: 12/29>>Chest x-ray: Multifocal pneumonia  COVID-19 medications: Steroids: 12/29>> Remdesivir: 12/29>> Actemra: 12/30 x 1                                                                 Hospital Course    Acute Hypoxic Resp Failure due to Covid 19 Viral pneumonia: Had severe hypoxemia-requiring up to 8 L of HFNC on initial presentation-slowly improving-down to 5-6 L of HFNC at rest.    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  I have clearly warned her that she has very high risk for readmission, stroke, death.  She has assumes all responsibility for it.  She will be discharged on 6 L home oxygen with outpatient PCP follow-up, rescue inhaler will be provided.  Husband [name] also informed.   SpO2: 90 % O2 Flow Rate (L/min): 6 L/min  Recent Labs  Lab 01/19/20 2032 01/20/20 0749 01/21/20 0334 01/22/20 0450 01/23/20 0156 01/24/20 0342  WBC  --  4.0 6.7 12.9* 13.0* 13.5*  CRP 12.1* 12.8* 7.7* 3.3* 2.2* 1.5*  DDIMER 1.24* 1.10* 0.75* 0.68* 0.63*  --  PROCALCITON <0.10  --   --   --   --   --   LATICACIDVEN 1.6  --   --   --   --   --   AST 48* 33 28 30 31 22   ALT 37 40 33 32 31 28  ALKPHOS 79 74 78 69 78 67  BILITOT 1.3* 1.1 0.4 0.7 0.7 0.6  ALBUMIN 3.2* 3.2* 3.0* 3.0* 2.9* 2.9*      HTN: BP controlled-continue losartan  Hypothyroidism: Continue Synthroid-TSH within normal limits  Anxiety: Relatively stable-continue Xanax-no longer on Wellbutrin.  Morbid obesity: Estimated body mass index is 42.27 follow with PCP for weight loss.  DM-2 (A1c 9.3 on 12/29) - poor outpatient control due to hyperglycemia, continue home regimen follow with PCP for further adjustment   Discharge diagnosis     Principal Problem:   Acute hypoxemic respiratory failure due to COVID-19 [facility]) Active Problems:   Diabetes mellitus type 2 in obese [facility])   Anxiety   Essential hypertension   Hypothyroidism (acquired)    Discharge instructions    Discharge Instructions    Discharge instructions   Complete by: As directed    Follow with Primary MD [facility], MD in 7 days   Get CBC, CMP, 2 view Chest X ray -  checked next visit within 1 week by Primary MD   Activity: As  tolerated with Full fall precautions use walker/cane & assistance as needed  Disposition Home    Diet: Low Carb    Special Instructions: If you have smoked or chewed Tobacco  in the last 2 yrs please stop smoking, stop any regular Alcohol  and or any Recreational drug use.  On your next visit with your primary care physician please Get Medicines reviewed and adjusted.  Please request your Prim.MD to go over all Hospital Tests and Procedure/Radiological results at the follow up, please get all Hospital records sent to your Prim MD by signing hospital release before you go home.  If you experience worsening of your admission symptoms, develop shortness of breath, life threatening emergency, suicidal or homicidal thoughts you must seek medical attention immediately by calling 911 or calling your MD immediately  if symptoms less severe.  You Must read complete instructions/literature along with all the possible adverse reactions/side effects for all the Medicines you take and that have been prescribed to you. Take any new Medicines after you have completely understood and accpet all the possible adverse reactions/side effects.   Increase activity slowly   Complete by: As directed       Discharge Medications   Allergies as of 01/24/2020      Reactions   Codeine Itching, Swelling   Other Other (See Comments)   Steroids  Causes patients hair to fall out   Sulfa Antibiotics Swelling   Ancef [cefazolin] Swelling, Rash      Medication List    STOP taking these medications   ibuprofen 200 MG tablet Commonly known as: ADVIL     TAKE these medications   acetaminophen 500 MG tablet Commonly known as: TYLENOL Take 1,000 mg by mouth every 6 (six) hours as needed for mild pain or headache.   albuterol (2.5 MG/3ML) 0.083% nebulizer solution Commonly known as: PROVENTIL Take 3 mLs (2.5 mg total) by nebulization every 6 (six) hours as needed for wheezing.   albuterol 108 (90 Base) MCG/ACT  inhaler Commonly known as: VENTOLIN HFA Inhale 1-2 puffs into the lungs every 4 (four) hours as needed for  wheezing (cough).   ALPRAZolam 0.5 MG tablet Commonly known as: XANAX Take 0.5 mg by mouth daily as needed for sleep or anxiety.   benzonatate 100 MG capsule Commonly known as: TESSALON Take 100-200 mg by mouth 3 (three) times daily as needed for cough.   levothyroxine 25 MCG tablet Commonly known as: SYNTHROID Take 25 mcg by mouth daily before breakfast.   losartan-hydrochlorothiazide 100-25 MG tablet Commonly known as: HYZAAR Take 1 tablet by mouth daily.   MELATONIN PO Take 2 tablets by mouth at bedtime.   metFORMIN 500 MG tablet Commonly known as: GLUCOPHAGE Take 1,000 mg by mouth every morning.   mupirocin ointment 2 % Commonly known as: BACTROBAN Apply 1 application topically 3 (three) times daily.   vitamin C with rose hips 500 MG tablet Take 500 mg by mouth daily.   zinc gluconate 50 MG tablet Take 50 mg by mouth daily.            Durable Medical Equipment  (From admission, onward)         Start     Ordered   01/24/20 0959  For home use only DME oxygen  Once       Question Answer Comment  Length of Need 6 Months   Mode or (Route) Nasal cannula   Liters per Minute 6   Frequency Continuous (stationary and portable oxygen unit needed)   Oxygen conserving device Yes   Oxygen delivery system Gas      01/24/20 0958           Follow-up Information    Assunta Found, MD. Schedule an appointment as soon as possible for a visit in 1 week(s).   Specialty: Family Medicine Contact information: [address] [place] [address] [phone]               Major procedures and Radiology Reports - PLEASE review detailed and final reports thoroughly  -       DG Chest 2 View  Result Date: 01/19/2020 CLINICAL DATA:  Fever, shortness of breath.  COVID positive. EXAM: CHEST - 2 VIEW COMPARISON:  September 14, 2015. FINDINGS: Patchy  bilateral airspace opacities. No visible pleural effusions or pneumothorax. Cardiomediastinal silhouette is within normal limits and similar to prior. Gastric band is in similar position. IMPRESSION: Patchy bilateral airspace opacities, compatible with multifocal pneumonia and reported COVID diagnosis. Electronically Signed   By: Feliberto Harts MD   On: 01/19/2020 12:58   DG Chest Port 1 View  Result Date: 01/24/2020 CLINICAL DATA:  Shortness of breath, COVID-19 EXAM: PORTABLE CHEST 1 VIEW COMPARISON:  Portable exam 0820 hours compared to 01/19/2020 FINDINGS: Normal heart size, mediastinal contours, and pulmonary vascularity. Again identified patchy BILATERAL pulmonary infiltrates consistent with multifocal pneumonia and COVID-19, not significantly changed. No pleural effusion or pneumothorax. Bones unremarkable. IMPRESSION: Persistent BILATERAL pulmonary infiltrates consistent with multifocal pneumonia and COVID-19 Electronically Signed   By: Ulyses Southward M.D.   On: 01/24/2020 08:32    Micro Results     Recent Results (from the past 240 hour(s))  Blood Culture (routine x 2)     Status: None (Preliminary result)   Collection Time: 01/19/20  8:32 PM   Specimen: BLOOD LEFT ARM  Result Value Ref Range Status   Specimen Description BLOOD LEFT ARM  Final   Special Requests   Final    BOTTLES DRAWN AEROBIC AND ANAEROBIC Blood Culture adequate volume   Culture   Final    NO GROWTH 4 DAYS  Performed at [facility], [address] [place], [place] [address]    Report Status PENDING  Incomplete    Today   Subjective    Elani Delph today has no headache,no chest abdominal pain,no new weakness tingling or numbness, feels much better wants to go home today and will sign out AMA if not discharged.   Objective   Blood pressure 118/69, pulse 70, temperature 98.7 F (37.1 C), temperature source Oral, resp. rate 20, height 5\' 5"  (1.651 m), weight 115.2 kg, SpO2 90 %.   Intake/Output  Summary (Last 24 hours) at 01/24/2020 0959 Last data filed at 01/23/2020 1230 Gross per 24 hour  Intake 240 ml  Output --  Net 240 ml    Exam  Awake Alert, No new F.N deficits, Normal affect [place].AT,PERRAL Supple Neck,No JVD, No cervical lymphadenopathy appriciated.  Symmetrical Chest wall movement, Good air movement bilaterally, CTAB RRR,No Gallops,Rubs or new Murmurs, No Parasternal Heave +ve B.Sounds, Abd Soft, Non tender, No organomegaly appriciated, No rebound -guarding or rigidity. No Cyanosis, Clubbing or edema, No new Rash or bruise   Data Review   CBC w Diff:  Lab Results  Component Value Date   WBC 13.5 (H) 01/24/2020   HGB 13.5 01/24/2020   HCT 40.5 01/24/2020   PLT 349 01/24/2020   LYMPHOPCT 15 01/24/2020   MONOPCT 7 01/24/2020   EOSPCT 0 01/24/2020   BASOPCT 1 01/24/2020    CMP:  Lab Results  Component Value Date   NA 136 01/24/2020   K 3.6 01/24/2020   CL 102 01/24/2020   CO2 25 01/24/2020   BUN 23 (H) 01/24/2020   CREATININE 0.79 01/24/2020   PROT 5.5 (L) 01/24/2020   ALBUMIN 2.9 (L) 01/24/2020   BILITOT 0.6 01/24/2020   ALKPHOS 67 01/24/2020   AST 22 01/24/2020   ALT 28 01/24/2020  .   Total Time in preparing paper work, data evaluation and todays exam - 35 minutes  03/23/2020 M.D on 01/24/2020 at 9:59 AM  [facility]

## 2020-01-24 NOTE — TOC Transition Note (Signed)
Transition of Care [facility]) - CM/SW Discharge Note   Patient Details  Name: [name] MRN: [id number] Date of Birth: 11/04/1965  [facility] CM/SW Contact:  Beckie Busing, RN Phone Number: [phone]  01/24/2020, 10:58 AM   Clinical Narrative:    [facility] consulted for Home O2. CM spoke with patient and patient gives permission to set up home O2. Home O2 has been set up with [facility]. No other needs noted at this time. TOC will sign off.    Final next level of care: Home/Self Care Barriers to Discharge: No Barriers Identified   Patient Goals and CMS Choice        Discharge Placement                       Discharge Plan and Services   Discharge Planning Services: CM Consult            DME Arranged: Oxygen DME Agency: [facility] Date DME Agency Contacted: 01/24/20 Time DME Agency Contacted: 1053 Representative spoke with at DME Agency: Oletha Cruel            Social Determinants of Health (SDOH) Interventions     Readmission Risk Interventions No flowsheet data found.

## 2020-01-26 ENCOUNTER — Encounter (HOSPITAL_COMMUNITY): Payer: Self-pay | Admitting: *Deleted

## 2020-01-26 ENCOUNTER — Other Ambulatory Visit: Payer: Self-pay

## 2020-01-26 ENCOUNTER — Emergency Department (HOSPITAL_COMMUNITY): Payer: No Typology Code available for payment source

## 2020-01-26 ENCOUNTER — Emergency Department (HOSPITAL_COMMUNITY)
Admission: EM | Admit: 2020-01-26 | Discharge: 2020-01-26 | Disposition: A | Discharge status: Home / Self Care | Payer: No Typology Code available for payment source | Attending: Emergency Medicine | Admitting: Emergency Medicine

## 2020-01-26 DIAGNOSIS — J9621 Acute and chronic respiratory failure with hypoxia: Secondary | ICD-10-CM | POA: Insufficient documentation

## 2020-01-26 DIAGNOSIS — R0602 Shortness of breath: Secondary | ICD-10-CM | POA: Diagnosis present

## 2020-01-26 DIAGNOSIS — E039 Hypothyroidism, unspecified: Secondary | ICD-10-CM | POA: Diagnosis not present

## 2020-01-26 DIAGNOSIS — Z7984 Long term (current) use of oral hypoglycemic drugs: Secondary | ICD-10-CM | POA: Diagnosis not present

## 2020-01-26 DIAGNOSIS — E119 Type 2 diabetes mellitus without complications: Secondary | ICD-10-CM | POA: Insufficient documentation

## 2020-01-26 DIAGNOSIS — I10 Essential (primary) hypertension: Secondary | ICD-10-CM | POA: Insufficient documentation

## 2020-01-26 DIAGNOSIS — Z79899 Other long term (current) drug therapy: Secondary | ICD-10-CM | POA: Diagnosis not present

## 2020-01-26 DIAGNOSIS — U071 COVID-19: Secondary | ICD-10-CM | POA: Diagnosis not present

## 2020-01-26 LAB — CBC
HCT: 42.7 % (ref 36.0–46.0)
Hemoglobin: 14.5 g/dL (ref 12.0–15.0)
MCH: 29.4 pg (ref 26.0–34.0)
MCHC: 34 g/dL (ref 30.0–36.0)
MCV: 86.4 fL (ref 80.0–100.0)
Platelets: 379 10*3/uL (ref 150–400)
RBC: 4.94 MIL/uL (ref 3.87–5.11)
RDW: 13.4 % (ref 11.5–15.5)
WBC: 8.9 10*3/uL (ref 4.0–10.5)
nRBC: 0 % (ref 0.0–0.2)

## 2020-01-26 LAB — BASIC METABOLIC PANEL
Anion gap: 13 (ref 5–15)
BUN: 19 mg/dL (ref 6–20)
CO2: 23 mmol/L (ref 22–32)
Calcium: 8.3 mg/dL — ABNORMAL LOW (ref 8.9–10.3)
Chloride: 97 mmol/L — ABNORMAL LOW (ref 98–111)
Creatinine, Ser: 0.78 mg/dL (ref 0.44–1.00)
GFR, Estimated: >60 mL/min (ref >60)
Glucose, Bld: 260 mg/dL — ABNORMAL HIGH (ref 70–99)
Potassium: 3.8 mmol/L (ref 3.5–5.1)
Sodium: 133 mmol/L — ABNORMAL LOW (ref 135–145)

## 2020-01-26 MED ORDER — ALBUTEROL SULFATE HFA 108 (90 BASE) MCG/ACT IN AERS: 2.0000 | INHALATION_SPRAY | RESPIRATORY_TRACT | Status: DC | PRN

## 2020-01-26 NOTE — ED Provider Notes (Signed)
[facility] Provider Note   CSN: [id number] Arrival date & time: 01/26/20  0304     History Chief Complaint  Patient presents with  . Shortness of Breath    [name] is a 55 y.o. female.  HPI     This is a 55 year old female with a history of diabetes, hypertension, hypothyroidism, recent hypoxic respiratory failure secondary to COVID-19 pneumonia who presents with low oxygen saturations.  Patient reports that she was discharged on Monday.  At that time she was discharged on 6 L of oxygen.  She states that she noted her O2 sats at home to be 84% on her pulse ox.  She has continued incentive spirometry at home and her albuterol inhaler.  She states that she does not feel that her respiratory status is any better or worse.  No worsening shortness of breath.  No chest pain.  She is not had any ongoing fevers.  I reviewed her chart.  She was discharged on Monday at her request.  She was thought to need several more days of optimization but did not want to stay.  She was discharged on 6 L.  Her last several pulse ox readings prior to discharge were 87- 90%.  Past Medical History:  Diagnosis Date  . Anxiety   . Diabetes mellitus type 2 in obese ([place])   . Essential hypertension   . Hypothyroidism (acquired) 01/19/2020    Patient Active Problem List   Diagnosis Date Noted  . Acute hypoxemic respiratory failure due to COVID-19 ([place]) 01/19/2020  . Hypothyroidism (acquired) 01/19/2020  . Diabetes mellitus type 2 in obese ([place])   . Anxiety   . Essential hypertension     Past Surgical History:  Procedure Laterality Date  . ABDOMINAL HYSTERECTOMY    . APPENDECTOMY    . CESAREAN SECTION    . LAPAROSCOPIC GASTRIC BANDING       OB History    Gravida  4   Para  4   Term  2   Preterm  2   AB      Living  4     SAB      IAB      Ectopic      Multiple      Live Births  3           Family History  Problem  Relation Age of Onset  . Cancer Maternal Grandmother        breast  . Transient ischemic attack Maternal Grandmother   . Cancer Maternal Grandfather        lung  . Cancer Paternal Grandmother        breast  . Diabetes Paternal Grandfather   . Heart disease Paternal Grandfather     Social History   Tobacco Use  . Smoking status: Never Smoker  . Smokeless tobacco: Never Used  Substance Use Topics  . Alcohol use: Yes    Comment: occasional  . Drug use: No    Home Medications Prior to Admission medications   Medication Sig Start Date End Date Taking? Authorizing Provider  acetaminophen (TYLENOL) 500 MG tablet Take 1,000 mg by mouth every 6 (six) hours as needed for mild pain or headache.    [provider]  albuterol (PROVENTIL) (2.5 MG/3ML) 0.083% nebulizer solution Take 3 mLs (2.5 mg total) by nebulization every 6 (six) hours as needed for wheezing. 10/03/12   Liam Graham, PA-C  albuterol (VENTOLIN HFA) 108 (  90 Base) MCG/ACT inhaler Inhale 1-2 puffs into the lungs every 4 (four) hours as needed for wheezing (cough). 01/24/20   Leroy Sea, MD  ALPRAZolam Prudy Feeler) 0.5 MG tablet Take 0.5 mg by mouth daily as needed for sleep or anxiety.    [provider]  Ascorbic Acid (VITAMIN C WITH ROSE HIPS) 500 MG tablet Take 500 mg by mouth daily.    [provider]  benzonatate (TESSALON) 100 MG capsule Take 100-200 mg by mouth 3 (three) times daily as needed for cough. 01/13/20   [provider]  levothyroxine (SYNTHROID, LEVOTHROID) 25 MCG tablet Take 25 mcg by mouth daily before breakfast.    [provider]  losartan-hydrochlorothiazide (HYZAAR) 100-25 MG tablet Take 1 tablet by mouth daily.    [provider]  MELATONIN PO Take 2 tablets by mouth at bedtime.    [provider]  metFORMIN (GLUCOPHAGE) 500 MG tablet Take 1,000 mg by mouth every morning.     [provider]  mupirocin ointment (BACTROBAN) 2 %  Apply 1 application topically 3 (three) times daily. 01/17/20   [provider]  zinc gluconate 50 MG tablet Take 50 mg by mouth daily.    [provider]    Allergies    Codeine, Other, Sulfa antibiotics, and Ancef [cefazolin]  Review of Systems   Review of Systems  Constitutional: Negative for fever.  Respiratory: Positive for shortness of breath. Negative for cough.   Cardiovascular: Negative for chest pain.  Gastrointestinal: Negative for abdominal pain, nausea and vomiting.  Genitourinary: Negative for dysuria.  Neurological:       Bilateral foot numbness  All other systems reviewed and are negative.   Physical Exam Updated Vital Signs BP 129/72   Pulse 75   Temp 98.4 F (36.9 C) (Oral)   Resp 17   Ht 1.651 m (5\' 5" )   Wt 115.2 kg   SpO2 93%   BMI 42.26 kg/m   Physical Exam Vitals and nursing note reviewed.  Constitutional:      Appearance: She is well-developed and well-nourished. She is obese. She is not ill-appearing.  HENT:     Head: Normocephalic and atraumatic.     Mouth/Throat:     Mouth: Mucous membranes are moist.  Eyes:     Pupils: Pupils are equal, round, and reactive to light.  Cardiovascular:     Rate and Rhythm: Normal rate and regular rhythm.     Heart sounds: Normal heart sounds.  Pulmonary:     Effort: Pulmonary effort is normal. No respiratory distress.     Breath sounds: No wheezing.     Comments: Nasal cannula in place, no respiratory distress, satting 92% while I am in the room on 6 L Abdominal:     General: Bowel sounds are normal.     Palpations: Abdomen is soft.  Musculoskeletal:     Cervical back: Neck supple.     Right lower leg: No tenderness. No edema.     Left lower leg: No tenderness. No edema.  Skin:    General: Skin is warm and dry.  Neurological:     Mental Status: She is alert and oriented to person, place, and time.  Psychiatric:        Mood and Affect: Mood and affect and mood normal.     ED  Results / Procedures / Treatments   Labs (all labs ordered are listed, but only abnormal results are displayed) Labs Reviewed  BASIC METABOLIC PANEL -  Abnormal; Notable for the following components:      Result Value   Sodium 133 (*)    Chloride 97 (*)    Glucose, Bld 260 (*)    Calcium 8.3 (*)    All other components within normal limits  CBC    EKG EKG Interpretation  Date/Time:  Wednesday January 26 2020 03:18:11 EST Ventricular Rate:  89 PR Interval:  150 QRS Duration: 72 QT Interval:  346 QTC Calculation: 420 R Axis:   -19 Text Interpretation: Normal sinus rhythm Anterior infarct , age undetermined Abnormal ECG No significant change since last tracing Confirmed by Ross Marcus ([id number]) on 01/26/2020 3:42:52 AM   Radiology DG Chest Portable 1 View  Result Date: 01/26/2020 CLINICAL DATA:  COVID-19 positive, hypoxia EXAM: PORTABLE CHEST 1 VIEW COMPARISON:  01/24/2020 FINDINGS: Single frontal view of the chest demonstrates a stable cardiac silhouette. Progressive multifocal bilateral airspace disease compatible with pneumonia. No effusion or pneumothorax. No acute bony abnormalities. IMPRESSION: 1. Progressive multifocal bilateral COVID-19 pneumonia. Electronically Signed   By: Sharlet Salina M.D.   On: 01/26/2020 03:39   DG Chest Port 1 View  Result Date: 01/24/2020 CLINICAL DATA:  Shortness of breath, COVID-19 EXAM: PORTABLE CHEST 1 VIEW COMPARISON:  Portable exam 0820 hours compared to 01/19/2020 FINDINGS: Normal heart size, mediastinal contours, and pulmonary vascularity. Again identified patchy BILATERAL pulmonary infiltrates consistent with multifocal pneumonia and COVID-19, not significantly changed. No pleural effusion or pneumothorax. Bones unremarkable. IMPRESSION: Persistent BILATERAL pulmonary infiltrates consistent with multifocal pneumonia and COVID-19 Electronically Signed   By: Ulyses Southward M.D.   On: 01/24/2020 08:32    Procedures Procedures (including critical  care time)  Medications Ordered in ED Medications  albuterol (VENTOLIN HFA) 108 (90 Base) MCG/ACT inhaler 2 puff (has no administration in time range)    ED Course  I have reviewed the triage vital signs and the nursing notes.  Pertinent labs & imaging results that were available during my care of the patient were reviewed by me and considered in my medical decision making (see chart for details).    MDM Rules/Calculators/A&P                          Patient presents with ongoing hypoxia related to COVID-19.  Was discharged on Monday with 6 L of oxygen.  Reports desats to 84%.  She is overall nontoxic and vital signs are notable for initial O2 sat of 99% on 6 L.  She is in no respiratory distress.  She actually denies any worsening shortness of breath or chest pain.  She was placed on the monitor.  Chest x-ray was obtained and shows some worsening infiltrates.  This is not necessarily surprising and may not reflect her acute state.  Lab work-up is reassuring without metabolic derangements.  Improving leukocytosis.  Patient was monitored for approximately 2 hours.  O2 sats 89 to 93%.  Compared to discharge O2 sats, these are actually improved.  I discussed this with the patient.  She appears to me at least at stable as she was when she was discharged on Monday.  Given that she is not having any worsening subjective symptoms, feel she is reasonable for discharge home with ongoing oxygen therapy.  Patient states that she feels comfortable with this plan.  Recommend primary care follow-up.  After history, exam, and medical workup I feel the patient has been appropriately medically screened and is safe for discharge home. Pertinent diagnoses were discussed  with the patient. Patient was given return precautions.  Final Clinical Impression(s) / ED Diagnoses Final diagnoses:  COVID-19  Acute on chronic respiratory failure with hypoxia [facility])    Rx / DC Orders ED Discharge Orders    None        Ebonique Hallstrom, Mayer Masker, MD 01/26/20 [id number]

## 2020-01-26 NOTE — ED Triage Notes (Signed)
Pt states that she was just d/c on Monday with COVID PNA, sent home on 6 liters oxygen. Reports tonight her saturations were 84% on 6 liters, she feels more short of breath. No recent fevers.

## 2020-01-26 NOTE — Discharge Instructions (Addendum)
You were seen today for hypoxia related to COVID-19.  Maintain your oxygen at 6 L at home.  Follow-up with your primary doctor in 1 week as advised at discharge for repeat lab work and chest x-ray.  If you develop worsening shortness of breath or chest pain, that would be reason to be reevaluated.

## 2020-02-11 ENCOUNTER — Other Ambulatory Visit: Payer: Self-pay

## 2020-02-11 ENCOUNTER — Other Ambulatory Visit (HOSPITAL_COMMUNITY): Payer: Self-pay | Admitting: Family Medicine

## 2020-02-11 ENCOUNTER — Ambulatory Visit (HOSPITAL_COMMUNITY)
Admission: RE | Admit: 2020-02-11 | Discharge: 2020-02-11 | Disposition: A | Discharge status: Home / Self Care | Payer: No Typology Code available for payment source | Source: Ambulatory Visit | Attending: Family Medicine | Admitting: Family Medicine

## 2020-02-11 DIAGNOSIS — U071 COVID-19: Secondary | ICD-10-CM | POA: Diagnosis not present

## 2020-10-25 ENCOUNTER — Ambulatory Visit: Payer: No Typology Code available for payment source | Admitting: Physician Assistant

## 2020-12-18 ENCOUNTER — Other Ambulatory Visit (HOSPITAL_COMMUNITY): Payer: Self-pay

## 2020-12-18 MED ORDER — OZEMPIC (1 MG/DOSE) 4 MG/3ML ~~LOC~~ SOPN
PEN_INJECTOR | SUBCUTANEOUS | 3 refills | Status: DC
  Filled 2020-12-18: qty 3, 28d supply, fill #0

## 2020-12-19 ENCOUNTER — Other Ambulatory Visit (HOSPITAL_COMMUNITY): Payer: Self-pay

## 2020-12-20 ENCOUNTER — Other Ambulatory Visit (HOSPITAL_COMMUNITY): Payer: Self-pay

## 2021-08-16 IMAGING — DX DG CHEST 1V PORT
1 series · 1 of 1 positions shown · non-contrast
Comparison: 01/24/2020

CLINICAL DATA: 1MWKY-MF positive, hypoxia

EXAM:
PORTABLE CHEST 1 VIEW

[chest ap]
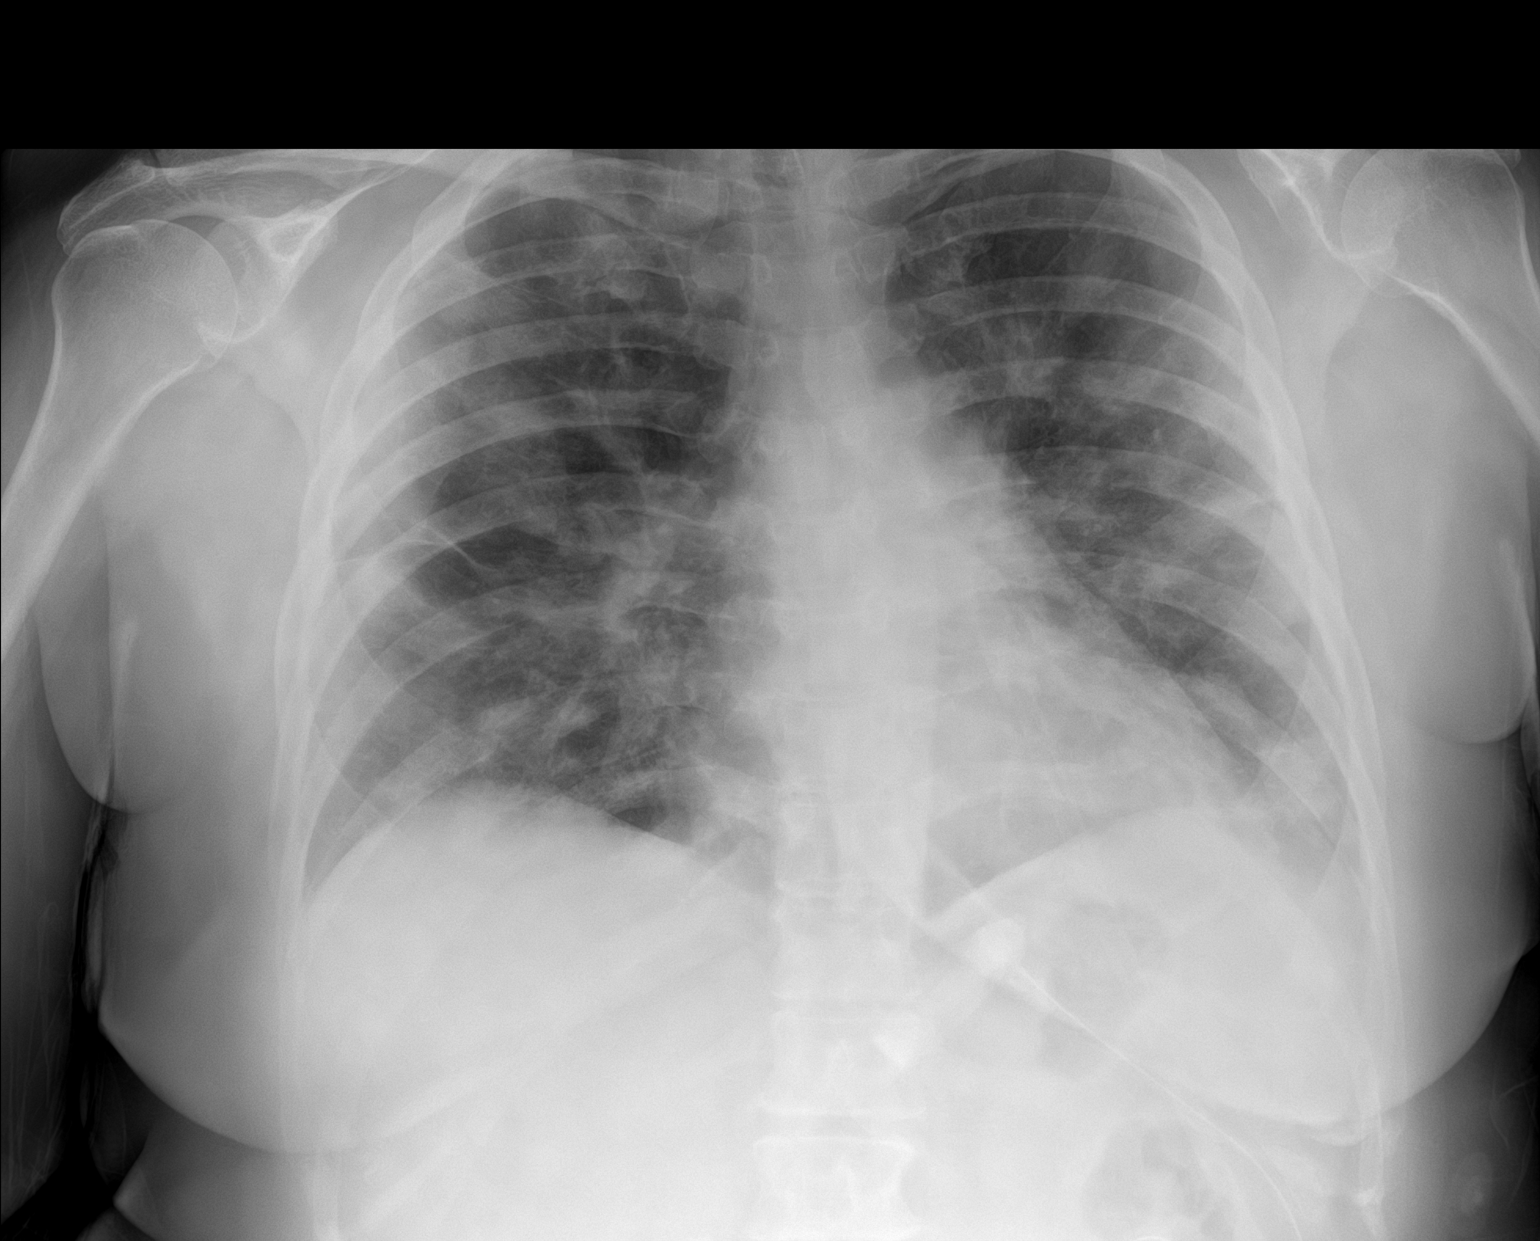

[1 of 1 positions shown; findings below may reference images not displayed]

FINDINGS: Single frontal view of the chest demonstrates a stable cardiac
silhouette. Progressive multifocal bilateral airspace disease
compatible with pneumonia. No effusion or pneumothorax. No acute
bony abnormalities.
IMPRESSION: 1. Progressive multifocal bilateral 1MWKY-MF pneumonia.

## 2021-09-01 IMAGING — DX DG CHEST 2V
2 series · 2 of 2 positions shown · non-contrast
Comparison: Multiple priors including chest radiograph January 26, 2020,January 24, 2020,January 19, 2020 and September 14, 2015.

CLINICAL DATA: Clinical diagnosis of severe acute respiratory
syndrome coronavirus 2, history of COVID pneumonia hospitalized
8116. Still requiring 4-6 L of O2.

EXAM:
CHEST - 2 VIEW

[chest pa]
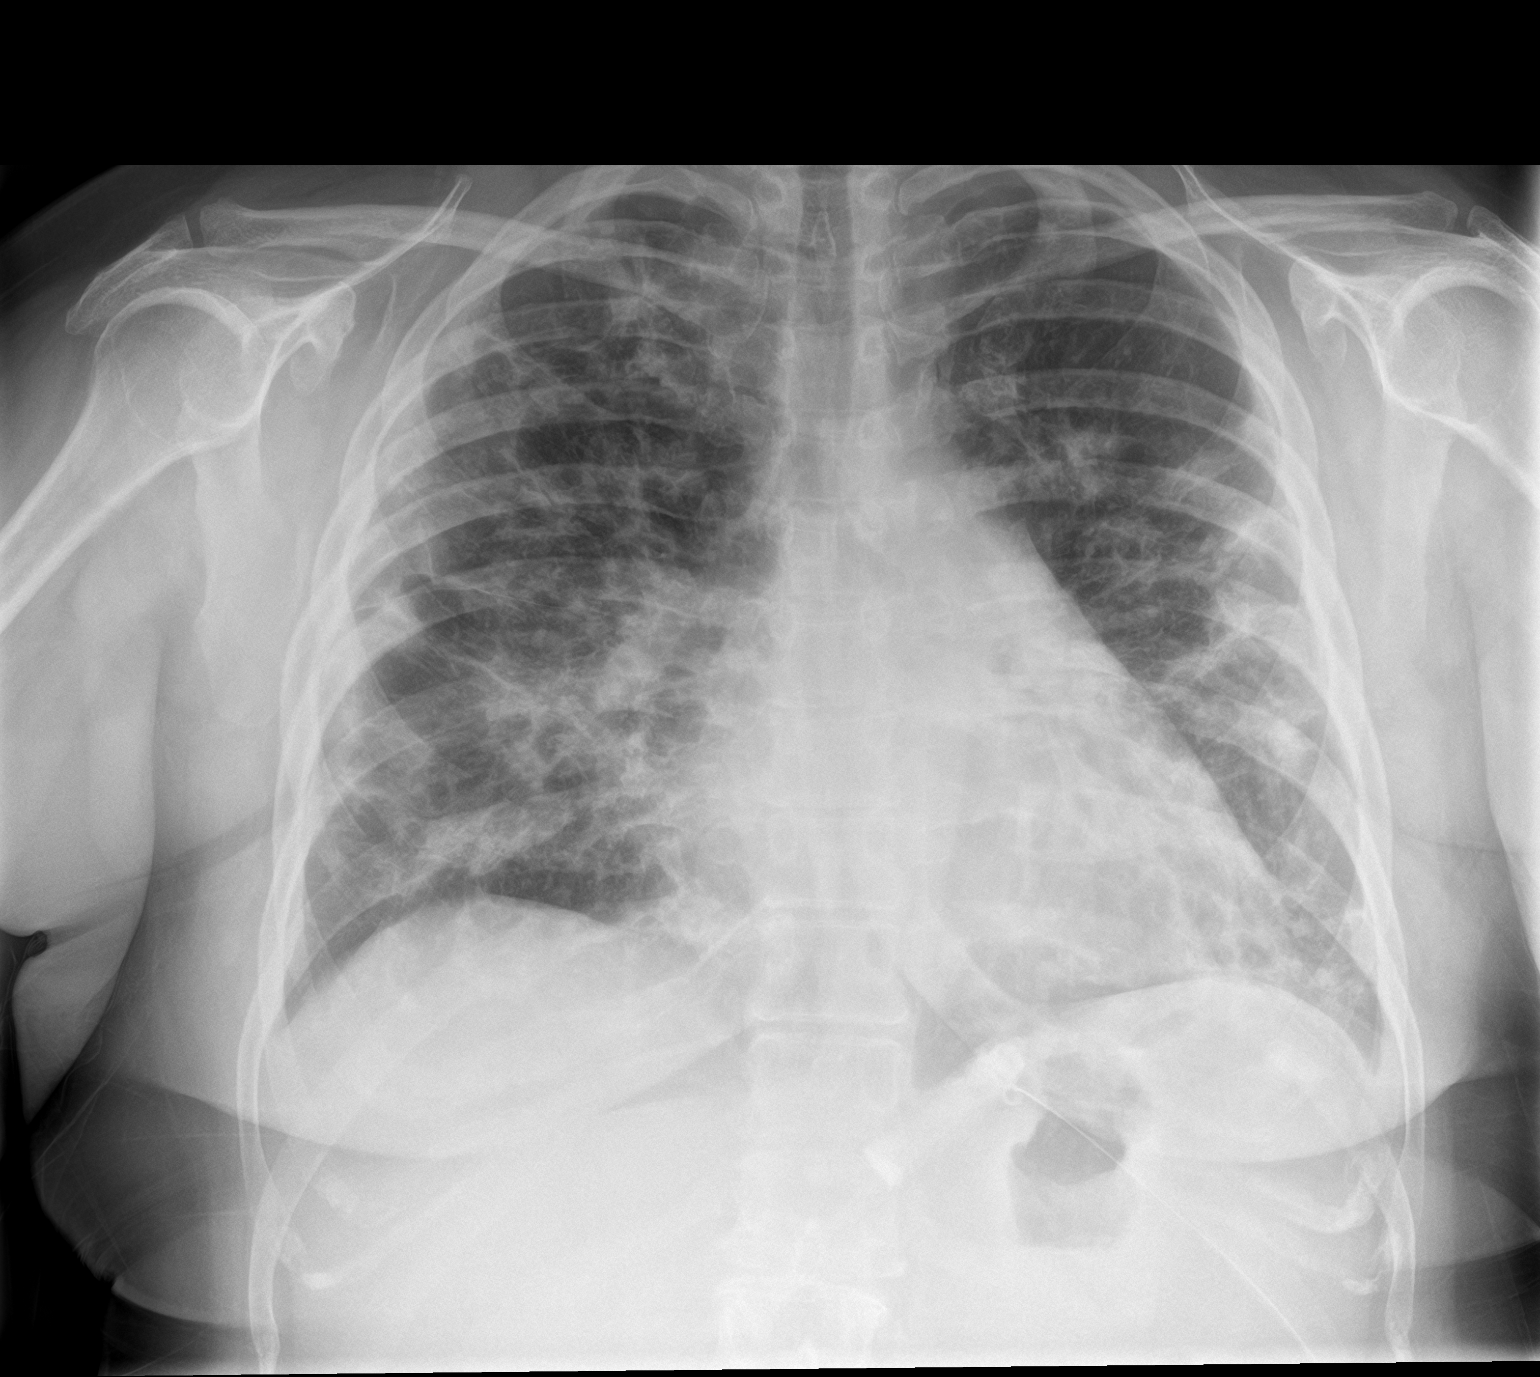

[chest lat]
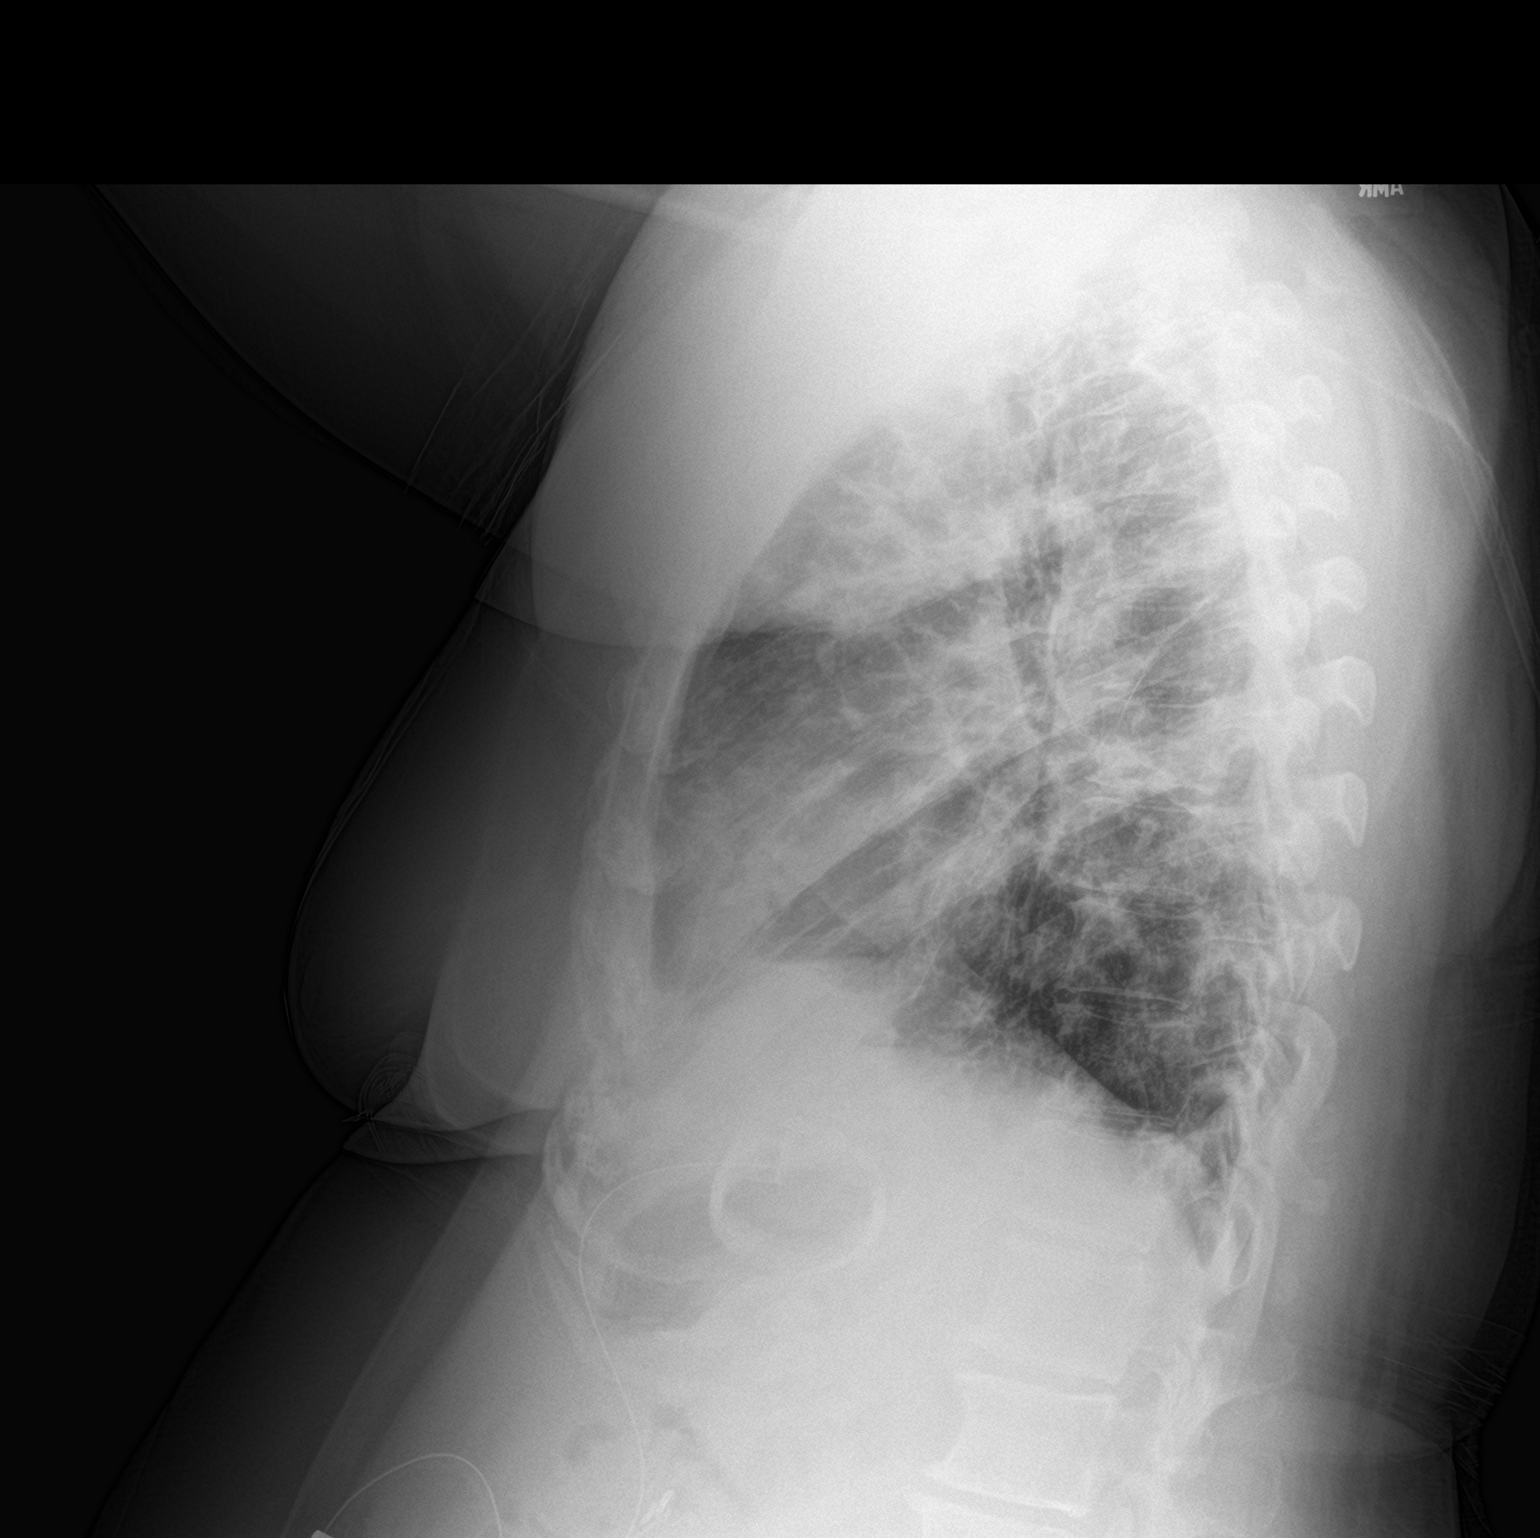

[2 of 2 positions shown; findings below may reference images not displayed]

FINDINGS: The heart size and mediastinal contours are stable. No significant
change in the bilateral multifocal airspace opacities. No pleural
effusion. No pneumothorax. The visualized skeletal structures are
unchanged.
IMPRESSION: No significant change in the bilateral multifocal airspace
opacities.

## 2021-12-07 ENCOUNTER — Ambulatory Visit
Admission: EM | Admit: 2021-12-07 | Discharge: 2021-12-07 | Disposition: A | Discharge status: Home / Self Care | Payer: No Typology Code available for payment source

## 2021-12-07 DIAGNOSIS — L03311 Cellulitis of abdominal wall: Secondary | ICD-10-CM

## 2021-12-07 MED ORDER — CHLORHEXIDINE GLUCONATE 4 % EX LIQD: Freq: Every day | CUTANEOUS | 0 refills | Status: DC | PRN

## 2021-12-07 MED ORDER — DOXYCYCLINE HYCLATE 100 MG PO CAPS: 100.0000 mg | ORAL_CAPSULE | Freq: Two times a day (BID) | ORAL | 0 refills | Status: DC

## 2021-12-07 NOTE — ED Triage Notes (Signed)
2 days ago noticed a rash on pubic bone, thought it was razor burn but states it has gotten redder and spreading. Patient says its tender and itchy.

## 2021-12-07 NOTE — ED Provider Notes (Signed)
[facility]    CSN: [id number] Arrival date & time: 12/07/21  1933      History   Chief Complaint Chief Complaint  Patient presents with   Rash    HPI [name] is a 56 y.o. female.   Presenting today with 2-day history of irritated rash to the pubic area and lower abdomen.  Initially started after shaving and she thought it was razor burn but it has formed some crusted pustular regions.  Denies fever, chills, drainage to the area, diaphoresis.  Has not been trying anything over-the-counter for symptoms.    Past Medical History:  Diagnosis Date   Anxiety    Diabetes mellitus type 2 in obese [facility])    Essential hypertension    Hypothyroidism (acquired) 01/19/2020    Patient Active Problem List   Diagnosis Date Noted   Acute hypoxemic respiratory failure due to COVID-19 ([facility]) 01/19/2020   Hypothyroidism (acquired) 01/19/2020   Diabetes mellitus type 2 in obese [facility])    Anxiety    Essential hypertension     Past Surgical History:  Procedure Laterality Date   ABDOMINAL HYSTERECTOMY     APPENDECTOMY     CESAREAN SECTION     LAPAROSCOPIC GASTRIC BANDING      OB History     Gravida  4   Para  4   Term  2   Preterm  2   AB      Living  4      SAB      IAB      Ectopic      Multiple      Live Births  3            Home Medications    Prior to Admission medications   Medication Sig Start Date End Date Taking? Authorizing Provider  ALPRAZolam Prudy Feeler) 0.5 MG tablet Take 0.5 mg by mouth daily as needed for sleep or anxiety.   Yes [provider]  Ascorbic Acid (VITAMIN C WITH ROSE HIPS) 500 MG tablet Take 500 mg by mouth daily.   Yes [provider]  chlorhexidine (HIBICLENS) 4 % external liquid Apply topically daily as needed. 12/07/21  Yes Particia Nearing, PA-C  doxycycline (VIBRAMYCIN) 100 MG capsule Take 1 capsule (100 mg total) by mouth 2 (two) times daily. 12/07/21  Yes Particia Nearing, PA-C  estradiol [facility]) 14 MCG/24HR Place 1 patch onto the skin once a week. 3 weeks on and 1 week off cycle. Apply the patch to a clean, dry, non-oily skin area of your lower abdomen, hips below the waist, or buttocks that has little or no hair and is free of cuts or irritation.   Yes [provider]  MELATONIN PO Take 2 tablets by mouth at bedtime.   Yes [provider]  progesterone (PROMETRIUM) 200 MG capsule Take 200 mg by mouth daily.   Yes [provider]  Semaglutide, 1 MG/DOSE, (OZEMPIC, 1 MG/DOSE,) 4 MG/3ML SOPN Inject 0.25mg  into the skin once weekly for 4 weeks. Then increase to 0.5mg  weekly for 4 weeks, then increase to 1 mg weekly for 4 weeks. 12/18/20  Yes   zinc gluconate 50 MG tablet Take 50 mg by mouth daily.   Yes [provider]  acetaminophen (TYLENOL) 500 MG tablet Take 1,000 mg by mouth every 6 (six) hours as needed for mild pain or headache.    [provider]  albuterol (PROVENTIL) (2.5 MG/3ML) 0.083% nebulizer solution Take  3 mLs (2.5 mg total) by nebulization every 6 (six) hours as needed for wheezing. 10/03/12   Baker, Adrian Blackwater, PA-C  albuterol (VENTOLIN HFA) 108 (90 Base) MCG/ACT inhaler Inhale 1-2 puffs into the lungs every 4 (four) hours as needed for wheezing (cough). 01/24/20   Leroy Sea, MD  benzonatate (TESSALON) 100 MG capsule Take 100-200 mg by mouth 3 (three) times daily as needed for cough. 01/13/20   [provider]  levothyroxine (SYNTHROID, LEVOTHROID) 25 MCG tablet Take 25 mcg by mouth daily before breakfast.    [provider]  losartan-hydrochlorothiazide (HYZAAR) 100-25 MG tablet Take 1 tablet by mouth daily.    [provider]  metFORMIN (GLUCOPHAGE) 500 MG tablet Take 1,000 mg by mouth every morning.     [provider]  mupirocin ointment (BACTROBAN) 2 % Apply 1 application topically 3 (three) times daily. 01/17/20   [provider]    Family  History Family History  Problem Relation Age of Onset   Cancer Maternal Grandmother        breast   Transient ischemic attack Maternal Grandmother    Cancer Maternal Grandfather        lung   Cancer Paternal Grandmother        breast   Diabetes Paternal Grandfather    Heart disease Paternal Grandfather     Social History Social History   Tobacco Use   Smoking status: Never   Smokeless tobacco: Never  Substance Use Topics   Alcohol use: Yes    Comment: occasional   Drug use: No     Allergies   Codeine, Other, Sulfa antibiotics, and Ancef [cefazolin]   Review of Systems Review of Systems PER HPI  Physical Exam Triage Vital Signs ED Triage Vitals [12/07/21 1941]  Enc Vitals Group     BP 131/83     Pulse Rate 85     Resp 16     Temp 98.5 F (36.9 C)     Temp Source Oral     SpO2 98 %     Weight      Height      Head Circumference      Peak Flow      Pain Score 2     Pain Loc      Pain Edu?      Excl. in GC?    No data found.  Updated Vital Signs BP 131/83 (BP Location: Right Arm)   Pulse 85   Temp 98.5 F (36.9 C) (Oral)   Resp 16   SpO2 98%   Visual Acuity Right Eye Distance:   Left Eye Distance:   Bilateral Distance:    Right Eye Near:   Left Eye Near:    Bilateral Near:     Physical Exam Vitals and nursing note reviewed.  Constitutional:      Appearance: Normal appearance. She is not ill-appearing.  HENT:     Head: Atraumatic.  Eyes:     Extraocular Movements: Extraocular movements intact.     Conjunctiva/sclera: Conjunctivae normal.  Cardiovascular:     Rate and Rhythm: Normal rate and regular rhythm.     Heart sounds: Normal heart sounds.  Pulmonary:     Effort: Pulmonary effort is normal.     Breath sounds: Normal breath sounds.  Musculoskeletal:        General: Normal range of motion.     Cervical back: Normal range of motion and neck supple.  Skin:  General: Skin is warm.     Findings: Rash present.     Comments:  Erythematous, warm region to lower abdomen extending down toward back region, central pustules and crusting forming  Neurological:     Mental Status: She is alert and oriented to person, place, and time.  Psychiatric:        Mood and Affect: Mood normal.        Thought Content: Thought content normal.        Judgment: Judgment normal.      UC Treatments / Results  Labs (all labs ordered are listed, but only abnormal results are displayed) Labs Reviewed - No data to display  EKG   Radiology No results found.  Procedures Procedures (including critical care time)  Medications Ordered in UC Medications - No data to display  Initial Impression / Assessment and Plan / UC Course  I have reviewed the triage vital signs and the nursing notes.  Pertinent labs & imaging results that were available during my care of the patient were reviewed by me and considered in my medical decision making (see chart for details).     Treat with doxycycline, Hibiclens, warm compresses, good wound care.  Return for worsening symptoms. Final Clinical Impressions(s) / UC Diagnoses   Final diagnoses:  Cellulitis of abdominal wall   Discharge Instructions   None    ED Prescriptions     Medication Sig Dispense Auth. Provider   doxycycline (VIBRAMYCIN) 100 MG capsule Take 1 capsule (100 mg total) by mouth 2 (two) times daily. 14 capsule Particia Nearing, [place]   chlorhexidine (HIBICLENS) 4 % external liquid Apply topically daily as needed. 120 mL Particia Nearing, [place]      PDMP not reviewed this encounter.   Particia Nearing, [place] 12/07/21 1956

## 2021-12-21 DIAGNOSIS — E559 Vitamin D deficiency, unspecified: Secondary | ICD-10-CM | POA: Diagnosis not present

## 2021-12-21 DIAGNOSIS — E119 Type 2 diabetes mellitus without complications: Secondary | ICD-10-CM | POA: Diagnosis not present

## 2021-12-21 DIAGNOSIS — E60 Dietary zinc deficiency: Secondary | ICD-10-CM | POA: Diagnosis not present

## 2021-12-21 DIAGNOSIS — E538 Deficiency of other specified B group vitamins: Secondary | ICD-10-CM | POA: Diagnosis not present

## 2022-01-02 DIAGNOSIS — E119 Type 2 diabetes mellitus without complications: Secondary | ICD-10-CM | POA: Diagnosis not present

## 2022-01-02 DIAGNOSIS — L659 Nonscarring hair loss, unspecified: Secondary | ICD-10-CM | POA: Diagnosis not present

## 2022-01-02 DIAGNOSIS — N951 Menopausal and female climacteric states: Secondary | ICD-10-CM | POA: Diagnosis not present

## 2022-01-02 DIAGNOSIS — G2581 Restless legs syndrome: Secondary | ICD-10-CM | POA: Diagnosis not present

## 2022-02-22 DIAGNOSIS — Z9884 Bariatric surgery status: Secondary | ICD-10-CM | POA: Diagnosis not present

## 2022-02-22 DIAGNOSIS — E039 Hypothyroidism, unspecified: Secondary | ICD-10-CM | POA: Diagnosis not present

## 2022-02-22 DIAGNOSIS — G43909 Migraine, unspecified, not intractable, without status migrainosus: Secondary | ICD-10-CM | POA: Diagnosis not present

## 2022-02-22 DIAGNOSIS — Z6836 Body mass index (BMI) 36.0-36.9, adult: Secondary | ICD-10-CM | POA: Diagnosis not present

## 2022-02-22 DIAGNOSIS — E6609 Other obesity due to excess calories: Secondary | ICD-10-CM | POA: Diagnosis not present

## 2022-02-22 DIAGNOSIS — F419 Anxiety disorder, unspecified: Secondary | ICD-10-CM | POA: Diagnosis not present

## 2022-02-22 DIAGNOSIS — E1165 Type 2 diabetes mellitus with hyperglycemia: Secondary | ICD-10-CM | POA: Diagnosis not present

## 2022-06-24 DIAGNOSIS — D485 Neoplasm of uncertain behavior of skin: Secondary | ICD-10-CM | POA: Diagnosis not present

## 2022-06-24 DIAGNOSIS — Z1283 Encounter for screening for malignant neoplasm of skin: Secondary | ICD-10-CM | POA: Diagnosis not present

## 2022-06-24 DIAGNOSIS — D225 Melanocytic nevi of trunk: Secondary | ICD-10-CM | POA: Diagnosis not present

## 2022-06-24 DIAGNOSIS — D235 Other benign neoplasm of skin of trunk: Secondary | ICD-10-CM | POA: Diagnosis not present

## 2022-06-25 DIAGNOSIS — E119 Type 2 diabetes mellitus without complications: Secondary | ICD-10-CM | POA: Diagnosis not present

## 2022-06-25 DIAGNOSIS — E559 Vitamin D deficiency, unspecified: Secondary | ICD-10-CM | POA: Diagnosis not present

## 2022-06-25 DIAGNOSIS — E60 Dietary zinc deficiency: Secondary | ICD-10-CM | POA: Diagnosis not present

## 2022-06-25 DIAGNOSIS — E538 Deficiency of other specified B group vitamins: Secondary | ICD-10-CM | POA: Diagnosis not present

## 2022-07-02 DIAGNOSIS — N951 Menopausal and female climacteric states: Secondary | ICD-10-CM | POA: Diagnosis not present

## 2022-07-02 DIAGNOSIS — G2581 Restless legs syndrome: Secondary | ICD-10-CM | POA: Diagnosis not present

## 2022-07-02 DIAGNOSIS — E119 Type 2 diabetes mellitus without complications: Secondary | ICD-10-CM | POA: Diagnosis not present

## 2022-07-02 DIAGNOSIS — L659 Nonscarring hair loss, unspecified: Secondary | ICD-10-CM | POA: Diagnosis not present

## 2022-07-07 DIAGNOSIS — R030 Elevated blood-pressure reading, without diagnosis of hypertension: Secondary | ICD-10-CM | POA: Diagnosis not present

## 2022-07-07 DIAGNOSIS — Z6833 Body mass index (BMI) 33.0-33.9, adult: Secondary | ICD-10-CM | POA: Diagnosis not present

## 2022-07-07 DIAGNOSIS — R300 Dysuria: Secondary | ICD-10-CM | POA: Diagnosis not present

## 2022-07-07 DIAGNOSIS — N390 Urinary tract infection, site not specified: Secondary | ICD-10-CM | POA: Diagnosis not present

## 2022-07-07 DIAGNOSIS — E669 Obesity, unspecified: Secondary | ICD-10-CM | POA: Diagnosis not present

## 2022-08-30 DIAGNOSIS — N951 Menopausal and female climacteric states: Secondary | ICD-10-CM | POA: Diagnosis not present

## 2022-08-30 DIAGNOSIS — R5383 Other fatigue: Secondary | ICD-10-CM | POA: Diagnosis not present

## 2022-10-17 ENCOUNTER — Other Ambulatory Visit: Payer: Self-pay

## 2022-10-17 ENCOUNTER — Emergency Department (HOSPITAL_COMMUNITY)
Admission: EM | Admit: 2022-10-17 | Discharge: 2022-10-17 | Disposition: A | Discharge status: Home / Self Care | Payer: BC Managed Care – PPO | Attending: Emergency Medicine | Admitting: Emergency Medicine

## 2022-10-17 ENCOUNTER — Emergency Department (HOSPITAL_COMMUNITY): Payer: BC Managed Care – PPO

## 2022-10-17 DIAGNOSIS — Z7984 Long term (current) use of oral hypoglycemic drugs: Secondary | ICD-10-CM | POA: Insufficient documentation

## 2022-10-17 DIAGNOSIS — E119 Type 2 diabetes mellitus without complications: Secondary | ICD-10-CM | POA: Diagnosis not present

## 2022-10-17 DIAGNOSIS — Z79899 Other long term (current) drug therapy: Secondary | ICD-10-CM | POA: Diagnosis not present

## 2022-10-17 DIAGNOSIS — R42 Dizziness and giddiness: Secondary | ICD-10-CM | POA: Insufficient documentation

## 2022-10-17 DIAGNOSIS — R519 Headache, unspecified: Secondary | ICD-10-CM | POA: Diagnosis present

## 2022-10-17 DIAGNOSIS — I10 Essential (primary) hypertension: Secondary | ICD-10-CM | POA: Diagnosis not present

## 2022-10-17 LAB — CBC WITH DIFFERENTIAL/PLATELET
Abs Immature Granulocytes: 0.02 10*3/uL (ref 0.00–0.07)
Basophils Absolute: 0.1 10*3/uL (ref 0.0–0.1)
Basophils Relative: 1 %
Eosinophils Absolute: 0.2 10*3/uL (ref 0.0–0.5)
Eosinophils Relative: 3 %
HCT: 43.3 % (ref 36.0–46.0)
Hemoglobin: 14.7 g/dL (ref 12.0–15.0)
Immature Granulocytes: 0 %
Lymphocytes Relative: 33 %
Lymphs Abs: 2.2 10*3/uL (ref 0.7–4.0)
MCH: 30.2 pg (ref 26.0–34.0)
MCHC: 33.9 g/dL (ref 30.0–36.0)
MCV: 89.1 fL (ref 80.0–100.0)
Monocytes Absolute: 0.4 10*3/uL (ref 0.1–1.0)
Monocytes Relative: 7 %
Neutro Abs: 3.6 10*3/uL (ref 1.7–7.7)
Neutrophils Relative %: 56 %
Platelets: 257 10*3/uL (ref 150–400)
RBC: 4.86 MIL/uL (ref 3.87–5.11)
RDW: 12.4 % (ref 11.5–15.5)
WBC: 6.5 10*3/uL (ref 4.0–10.5)
nRBC: 0 % (ref 0.0–0.2)

## 2022-10-17 LAB — URINALYSIS, ROUTINE W REFLEX MICROSCOPIC
Bilirubin Urine: NEGATIVE
Glucose, UA: NEGATIVE mg/dL
Hgb urine dipstick: NEGATIVE
Ketones, ur: NEGATIVE mg/dL
Leukocytes,Ua: NEGATIVE
Nitrite: NEGATIVE
Protein, ur: NEGATIVE mg/dL
Specific Gravity, Urine: 1.012 (ref 1.005–1.030)
pH: 6 (ref 5.0–8.0)

## 2022-10-17 LAB — COMPREHENSIVE METABOLIC PANEL
ALT: 13 U/L (ref 0–44)
AST: 14 U/L — ABNORMAL LOW (ref 15–41)
Albumin: 4 g/dL (ref 3.5–5.0)
Alkaline Phosphatase: 38 U/L (ref 38–126)
Anion gap: 12 (ref 5–15)
BUN: 14 mg/dL (ref 6–20)
CO2: 18 mmol/L — ABNORMAL LOW (ref 22–32)
Calcium: 8.4 mg/dL — ABNORMAL LOW (ref 8.9–10.3)
Chloride: 105 mmol/L (ref 98–111)
Creatinine, Ser: 0.78 mg/dL (ref 0.44–1.00)
GFR, Estimated: >60 mL/min (ref >60)
Glucose, Bld: 114 mg/dL — ABNORMAL HIGH (ref 70–99)
Potassium: 3.9 mmol/L (ref 3.5–5.1)
Sodium: 135 mmol/L (ref 135–145)
Total Bilirubin: 0.6 mg/dL (ref 0.3–1.2)
Total Protein: 6.4 g/dL — ABNORMAL LOW (ref 6.5–8.1)

## 2022-10-17 LAB — TROPONIN I (HIGH SENSITIVITY)
Troponin I (High Sensitivity): <2 ng/L (ref <18)
Troponin I (High Sensitivity): <2 ng/L (ref <18)

## 2022-10-17 LAB — CBG MONITORING, ED: Glucose-Capillary: 118 mg/dL — ABNORMAL HIGH (ref 70–99)

## 2022-10-17 MED ORDER — LACTATED RINGERS IV BOLUS
1000.0000 mL | Freq: Once | INTRAVENOUS | Status: AC
  Administered 2022-10-17: 1000 mL via INTRAVENOUS

## 2022-10-17 NOTE — ED Notes (Signed)
Patient transported to X-ray 

## 2022-10-17 NOTE — ED Provider Notes (Signed)
[place] [facility]ERGENCY DEPARTMENT AT [facility] Provider Note   CSN: [id number] Arrival date & time: 10/17/22  0533     History  No chief complaint on file.   [name] is a 57 y.o. female.  57 yo F w/o a lot of medical history aside form DM on mounjaro and s/p hysterectomy on HRT started in June. Here for a few days of feeling abnormal. Started Monday with headache similar to migraine but also some light headedness and weakness. Headache improved by Tuesday but the other symptoms have persistent. She had stayed home and rested with some intermittent dullness in her head but no more severe headaches and not really even what she would consider a headache. Over the last 12 hours has had a couple low BP's that didn't respond to fluids and thus came here for further evaluation.  No fevers, dysuria, cough, trauma, neuro changes, sick contacts, vomiting, diarrhea or other contributing symptoms. Has been on mounjaro for about a year without a recent dose change or other issues. 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         Home Medications Prior to Admission medications   Medication Sig Start Date End Date Taking? Authorizing Provider  acetaminophen (TYLENOL) 500 MG tablet Take 1,000 mg by mouth every 6 (six) hours as needed for mild pain or headache.    [provider]  albuterol (PROVENTIL) (2.5 MG/3ML) 0.083% nebulizer solution Take 3 mLs (2.5 mg total) by nebulization every 6 (six) hours as needed for wheezing. 10/03/12   Baker, Adrian Blackwater, PA-C  albuterol (VENTOLIN HFA) 108 (90 Base) MCG/ACT inhaler Inhale 1-2 puffs into the lungs every 4 (four) hours as needed for wheezing (cough). 01/24/20   Leroy Sea, MD  ALPRAZolam Prudy Feeler) 0.5 MG tablet Take 0.5 mg by mouth daily as needed for sleep or anxiety.    [provider]  Ascorbic Acid (VITAMIN C WITH ROSE HIPS) 500 MG tablet Take  500 mg by mouth daily.    [provider]  benzonatate (TESSALON) 100 MG capsule Take 100-200 mg by mouth 3 (three) times daily as needed for cough. 01/13/20   [provider]  chlorhexidine (HIBICLENS) 4 % external liquid Apply topically daily as needed. 12/07/21   Particia Nearing, PA-C  doxycycline (VIBRAMYCIN) 100 MG capsule Take 1 capsule (100 mg total) by mouth 2 (two) times daily. 12/07/21   Particia Nearing, PA-C  estradiol [facility]) 14 MCG/24HR Place 1 patch onto the skin once a week. 3 weeks on and 1 week off cycle. Apply the patch to a clean, dry, non-oily skin area of your lower abdomen, hips below the waist, or buttocks that has little or no hair and is free of cuts or irritation.    [provider]  levothyroxine (SYNTHROID, LEVOTHROID) 25 MCG tablet Take 25 mcg by mouth daily before breakfast.    [provider]  losartan-hydrochlorothiazide (HYZAAR) 100-25 MG tablet Take 1 tablet by mouth daily.    [provider]  MELATONIN PO Take 2 tablets by mouth at bedtime.    [provider]  metFORMIN (GLUCOPHAGE) 500 MG tablet Take 1,000 mg by mouth every morning.     [provider]  mupirocin ointment (BACTROBAN) 2 % Apply 1 application topically 3 (three) times daily. 01/17/20   [provider]  progesterone (PROMETRIUM) 200 MG capsule Take 200 mg by mouth daily.  [provider]  Semaglutide, 1 MG/DOSE, (OZEMPIC, 1 MG/DOSE,) 4 MG/3ML SOPN Inject 0.25mg  into the skin once weekly for 4 weeks. Then increase to 0.5mg  weekly for 4 weeks, then increase to 1 mg weekly for 4 weeks. 12/18/20     zinc gluconate 50 MG tablet Take 50 mg by mouth daily.    [provider]      Allergies    Codeine, Other, Sulfa antibiotics, and Ancef [cefazolin]    Review of Systems   Review of Systems  Physical Exam Updated Vital Signs BP 121/83   Pulse 65   Temp 98.3 F (36.8 C) (Oral)   Resp 10    SpO2 94%  Physical Exam Vitals and nursing note reviewed.  Constitutional:      Appearance: She is well-developed.  HENT:     Head: Normocephalic and atraumatic.     Mouth/Throat:     Pharynx: No oropharyngeal exudate or posterior oropharyngeal erythema.  Eyes:     Pupils: Pupils are equal, round, and reactive to light.  Cardiovascular:     Rate and Rhythm: Normal rate and regular rhythm.  Pulmonary:     Effort: No respiratory distress.     Breath sounds: No stridor.  Abdominal:     General: There is no distension.  Musculoskeletal:     Cervical back: Normal range of motion.     Comments: No altered mental status, able to give full seemingly accurate history.  Face is symmetric, EOM's intact, pupils equal and reactive, vision intact, tongue and uvula midline without deviation. Upper and Lower extremity motor 5/5, intact pain perception in distal extremities, 2+ reflexes in biceps, patella and achilles tendons. Able to perform finger to nose normal with both hands. Walks without assistance or evident ataxia.    Skin:    General: Skin is warm and dry.     Findings: No rash.  Neurological:     General: No focal deficit present.     Mental Status: She is alert.     Comments: No altered mental status, able to give full seemingly accurate history.  Face is symmetric, EOM's intact, pupils equal and reactive, vision intact, tongue and uvula midline without deviation. Upper and Lower extremity motor 5/5, intact pain perception in distal extremities, 2+ reflexes in biceps, patella and achilles tendons. Able to perform finger to nose normal with both hands. Walks without assistance or evident ataxia.      ED Results / Procedures / Treatments   Labs (all labs ordered are listed, but only abnormal results are displayed) Labs Reviewed  CBG MONITORING, ED - Abnormal; Notable for the following components:      Result Value   Glucose-Capillary 118 (*)    All other components within normal  limits  CBC WITH DIFFERENTIAL/PLATELET  COMPREHENSIVE METABOLIC PANEL  URINALYSIS, ROUTINE W REFLEX MICROSCOPIC  TROPONIN I (HIGH SENSITIVITY)    EKG None  Radiology DG Chest 2 View  Result Date: 10/17/2022 CLINICAL DATA:  Evaluate for fatigue and dizziness. EXAM: CHEST - 2 VIEW COMPARISON:  02/11/2020 . FINDINGS: Heart size is normal. There is no pleural fluid or interstitial edema. No airspace opacities identified. Gastric band is identified within the epigastric region. Cholecystectomy clips noted. IMPRESSION: 1. No acute cardiopulmonary disease. 2. Status post cholecystectomy and gastric banding. Electronically Signed   By: Signa Kell M.D.   On: 10/17/2022 06:52    Procedures Procedures    Medications Ordered in ED Medications  lactated ringers bolus 1,000 mL (1,000  mLs Intravenous New Bag/Given 10/17/22 0721)    ED Course/ Medical Decision Making/ A&P                                 Medical Decision Making Amount and/or Complexity of Data Reviewed Labs: ordered. Radiology: ordered. ECG/medicine tests: ordered.   No real clues to what could be causing her to feel weak and low BP based on history or exam. Will plan a relatively broad workup but hold on head ct. She does state that her HRT pellets are due to be replaced soon and this is the first time she has had them. As she is on test/estro/progestl if these levels were low it certainly would cause some issues that could be very similar to this. Will check orthostatics and gie some fluids pending workup. Of note, she has had at least 7 BP's here all of which have been in normal range, no tachycardia.   Care transferred pending completion of workup, reevaluation and disposition.   Final Clinical Impression(s) / ED Diagnoses Final diagnoses:  None    Rx / DC Orders ED Discharge Orders     None         Damyen Knoll, Barbara Cower, MD 10/18/22 0210

## 2022-10-17 NOTE — Discharge Instructions (Signed)
We saw you in the ER for dizziness. All the results in the ER are normal, labs and imaging. We are not sure what is causing your symptoms. The workup in the ER is not complete, and is limited to screening for life threatening and emergent conditions only, so please see a primary care doctor for further evaluation.  Please return to the ER if the headache gets severe and in not improving, you have associated new one sided numbness, tingling, weakness or confusion, seizures, poor balance or poor vision.

## 2022-10-17 NOTE — ED Provider Notes (Signed)
  Physical Exam  BP 128/84 (BP Location: Left Arm)   Pulse 73   Temp 98.3 F (36.8 C) (Oral)   Resp 18   SpO2 98%   Physical Exam  Procedures  Procedures  ED Course / MDM    Medical Decision Making Amount and/or Complexity of Data Reviewed Labs: ordered. Radiology: ordered. ECG/medicine tests: ordered.   Pt comes in with cc of headaches and dizziness for a couple of days. Hx of migraines. Recently started hormone replacement. Pt has hx of DM, HTN. BP low when she checked at home. BP normal here.  Labs pending. EKG pending. Orthostatics ordered.  If BP remains stable, labs reassuring - then plan is for PCP follow up. Headache is not overtly concerning to the patient -the low BP and dizziness were primary issues.         Derwood Kaplan, MD 10/17/22 [phone]

## 2022-10-17 NOTE — ED Triage Notes (Signed)
Pt states she started feeling dizzy and fatigued on Monday, thought it was related to a migraine but it has not improved. Reports taking her BP this morning and was hypotensive 88/43. Pt BP is 110/68 in triage
